# Patient Record
Sex: Female | Born: 1990 | Race: White | Hispanic: No | Marital: Married | Smoking: Never smoker
Health system: Southern US, Community
[De-identification: ages and names within clinical notes are randomized; demographics above are authoritative.]

## PROBLEM LIST (undated history)

## (undated) DIAGNOSIS — R5383 Other fatigue: Secondary | ICD-10-CM

## (undated) DIAGNOSIS — Z8619 Personal history of other infectious and parasitic diseases: Secondary | ICD-10-CM

## (undated) DIAGNOSIS — H9190 Unspecified hearing loss, unspecified ear: Secondary | ICD-10-CM

## (undated) DIAGNOSIS — I498 Other specified cardiac arrhythmias: Secondary | ICD-10-CM

## (undated) DIAGNOSIS — R51 Headache: Secondary | ICD-10-CM

## (undated) DIAGNOSIS — F419 Anxiety disorder, unspecified: Secondary | ICD-10-CM

## (undated) DIAGNOSIS — J189 Pneumonia, unspecified organism: Secondary | ICD-10-CM

## (undated) DIAGNOSIS — G8929 Other chronic pain: Secondary | ICD-10-CM

## (undated) DIAGNOSIS — R519 Headache, unspecified: Secondary | ICD-10-CM

## (undated) DIAGNOSIS — R0600 Dyspnea, unspecified: Secondary | ICD-10-CM

## (undated) DIAGNOSIS — N946 Dysmenorrhea, unspecified: Secondary | ICD-10-CM

## (undated) DIAGNOSIS — E611 Iron deficiency: Secondary | ICD-10-CM

## (undated) DIAGNOSIS — I10 Essential (primary) hypertension: Secondary | ICD-10-CM

## (undated) DIAGNOSIS — G90A Postural orthostatic tachycardia syndrome (POTS): Secondary | ICD-10-CM

## (undated) DIAGNOSIS — R112 Nausea with vomiting, unspecified: Secondary | ICD-10-CM

## (undated) DIAGNOSIS — S060X9A Concussion with loss of consciousness of unspecified duration, initial encounter: Secondary | ICD-10-CM

## (undated) DIAGNOSIS — Z9889 Other specified postprocedural states: Secondary | ICD-10-CM

## (undated) DIAGNOSIS — M419 Scoliosis, unspecified: Secondary | ICD-10-CM

## (undated) DIAGNOSIS — D649 Anemia, unspecified: Secondary | ICD-10-CM

## (undated) DIAGNOSIS — Z8742 Personal history of other diseases of the female genital tract: Secondary | ICD-10-CM

## (undated) DIAGNOSIS — R002 Palpitations: Secondary | ICD-10-CM

## (undated) HISTORY — DX: Personal history of other diseases of the female genital tract: Z87.42

## (undated) HISTORY — DX: Postural orthostatic tachycardia syndrome (POTS): G90.A

## (undated) HISTORY — DX: Headache: R51

## (undated) HISTORY — DX: Other chronic pain: G89.29

## (undated) HISTORY — DX: Scoliosis, unspecified: M41.9

## (undated) HISTORY — DX: Iron deficiency: E61.1

## (undated) HISTORY — DX: Palpitations: R00.2

## (undated) HISTORY — PX: OTHER SURGICAL HISTORY: SHX169

## (undated) HISTORY — DX: Other specified cardiac arrhythmias: I49.8

## (undated) HISTORY — DX: Personal history of other infectious and parasitic diseases: Z86.19

## (undated) HISTORY — DX: Dysmenorrhea, unspecified: N94.6

## (undated) HISTORY — DX: Anxiety disorder, unspecified: F41.9

## (undated) HISTORY — DX: Other fatigue: R53.83

## (undated) HISTORY — DX: Dyspnea, unspecified: R06.00

## (undated) HISTORY — DX: Anemia, unspecified: D64.9

## (undated) HISTORY — DX: Concussion with loss of consciousness of unspecified duration, initial encounter: S06.0X9A

## (undated) HISTORY — DX: Headache, unspecified: R51.9

---
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
## 2011-05-24 HISTORY — PX: WISDOM TOOTH EXTRACTION: SHX21

## 2011-08-20 ENCOUNTER — Encounter: Payer: Self-pay | Admitting: Internal Medicine

## 2011-08-20 ENCOUNTER — Ambulatory Visit (INDEPENDENT_AMBULATORY_CARE_PROVIDER_SITE_OTHER): Payer: BC Managed Care – PPO | Admitting: Internal Medicine

## 2011-08-20 DIAGNOSIS — R0600 Dyspnea, unspecified: Secondary | ICD-10-CM

## 2011-08-20 DIAGNOSIS — D649 Anemia, unspecified: Secondary | ICD-10-CM

## 2011-08-20 DIAGNOSIS — R209 Unspecified disturbances of skin sensation: Secondary | ICD-10-CM

## 2011-08-20 DIAGNOSIS — R202 Paresthesia of skin: Secondary | ICD-10-CM

## 2011-08-20 DIAGNOSIS — R0609 Other forms of dyspnea: Secondary | ICD-10-CM

## 2011-08-20 DIAGNOSIS — R002 Palpitations: Secondary | ICD-10-CM

## 2011-08-20 NOTE — Patient Instructions (Signed)
Your physician recommends that you continue on your current medications as directed. Please refer to the Current Medication list given to you today. Your physician has recommended that you wear an event monitor. Event monitors are medical devices that record the heart's electrical activity. Doctors most often [place] these monitors to diagnose arrhythmias. Arrhythmias are problems with the speed or rhythm of the heartbeat. The monitor is a small, portable device. You can wear one while you do your normal daily activities. This is usually used to diagnose what is causing palpitations/syncope (passing out). DX 785.1 30 DAY EVENT MONITOR Your physician has requested that you have an echocardiogram. Echocardiography is a painless test that uses sound waves to create images of your heart. It provides your doctor with information about the size and shape of your heart and how well your heart's chambers and valves are working. This procedure takes approximately one hour. There are no restrictions for this procedure. DUE  IN NOV DX DYSPNEA Your physician has requested that you have an exercise tolerance test. For further information please visit [email or web address] Please also follow instruction sheet, as given. DUE IN NOV DX DYSPNEA

## 2011-08-21 ENCOUNTER — Encounter: Payer: Self-pay | Admitting: Internal Medicine

## 2011-08-21 DIAGNOSIS — R202 Paresthesia of skin: Secondary | ICD-10-CM | POA: Insufficient documentation

## 2011-08-21 DIAGNOSIS — R0600 Dyspnea, unspecified: Secondary | ICD-10-CM | POA: Insufficient documentation

## 2011-08-21 DIAGNOSIS — R002 Palpitations: Secondary | ICD-10-CM | POA: Insufficient documentation

## 2011-08-21 DIAGNOSIS — D649 Anemia, unspecified: Secondary | ICD-10-CM | POA: Insufficient documentation

## 2011-08-21 NOTE — Assessment & Plan Note (Signed)
She has paresthesias in her upper and lower extremities associated with various angles of her joints. I was not able to reproduce any loss of pulse in her upper extremities with maneuvers. I'm not sure that I have an explanation for this. I certainly don't know how it utilized with the prior symptoms.

## 2011-08-21 NOTE — Assessment & Plan Note (Signed)
Produced he is a little bit concerning. Especially so as she has accompanying edema. We will get a 2-D echo cardiogram to look at left ventricular function and assess right ventricular and pulmonary pressures. She has scoliosis, but this has not been so limiting that I would have expected it to cause hemodynamic consequences to her right side. With the keep this in mind.

## 2011-08-21 NOTE — Progress Notes (Signed)
HPI: Stephanie Hudson is a 20 y.o. female At the request of Dr. Katrinka Blazing.  She has a multitude of complaints. About 6 months ago,She began having a sensation that her heart was skipping.  These episodes are quite brief. She feels him initially in her throat. There is a sensation of the heart beating quickly. The duration is seconds. They have become increasingly frequent. They're unassociated with caffeine. He thinks he may be aggravated by stress.  She also has noted dyspnea on exertion. SHe used to be quite fit  trained as a Horticulturist, commercial. She is to be in the gym daily. He is no longer period she has a history of asthma, but she thinks that this is different. SHe now finds herself short of breath when she goes to classes. Interestingly, she also has an history of edema. She was on a diuretic because of ear issues earlier the edema resolving there with. With the discontinuation of the sternal activities had some reaccumulation.  She also has complaints of tingling in her hands, especially when she raises her arms as if driving in the car with her head on the window. Interestingly also, she has tingling in her feet when she sits on the floor for prolonged periods of time.  Her past medical history in addition to the above is notable for scoliosis Current Outpatient Prescriptions  Medication Sig Dispense Refill  . albuterol (PROVENTIL HFA;VENTOLIN HFA) 108 (90 BASE) MCG/ACT inhaler Inhale 2 puffs into the lungs every 6 (six) hours as needed.        . Fluticasone-Salmeterol (ADVAIR) 100-50 MCG/DOSE AEPB Inhale 1 puff into the lungs every 12 (twelve) hours.        . IRON PO Take by mouth 2 (two) times daily.        . Norethindrone Acetate-Ethinyl Estrad-FE (LOESTRIN 24 FE) 1-20 MG-MCG(24) tablet Take 1 tablet by mouth daily.          Allergies  Allergen Reactions  . Latex     Past Medical History  Diagnosis Date  . Chronic headaches   . Asthma   . Dysmenorrhea   . Fatigue   . Anemia     Past  Surgical History  Procedure Date  . Tonsillectomy   . Tubes     In ears as baby    No family history on file.  History   Social History  . Marital Status: Single    Spouse Name: N/A    Number of Children: N/A  . Years of Education: N/A   Occupational History  . Not on file.   Social History Main Topics  . Smoking status: Never Smoker   . Smokeless tobacco: Not on file  . Alcohol Use: No  . Drug Use:   . Sexually Active:    Other Topics Concern  . Not on file   Social History Narrative  . No narrative on file    Fourteen point review of systems was negative except as noted in HPI and PMH   PHYSICAL EXAMINATION  Blood pressure 104/72, pulse 65, height 5' 7.5" (1.715 m), weight 135 lb 12.8 oz (61.598 kg).   Well developed and nourished in no acute distress HENT normal Neck supple with JVP-flat Carotids brisk and full without bruits Back  Was notable scoliosis Clear Regular rate and rhythm, no murmurs or gallops Abd-soft with active BS without hepatomegaly or midline pulsation Femoral pulses 2+ distal pulses intact No Clubbing cyanosis edema Skin-warm and dry LN-neg submandibular and supraclavicular A &  Oriented CN 3-12 normal  Grossly normal sensory and motor function Affect engaging . Sinus rhythm at 65 Intervals 0.1/0.08/0.42 Rightward axis at 91

## 2011-08-21 NOTE — Assessment & Plan Note (Signed)
The patient has palpitations that are quite brief. Statistically about 70% of people will have arrhythmia associated with her palpitations, and the easiest way to clarify this would be to use an event recorder. We will use a single event recorder. She would like to wait until November.

## 2011-08-21 NOTE — Assessment & Plan Note (Signed)
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 Still, however she remains anemic and I went ahead as a contributing to the above.

## 2011-09-13 ENCOUNTER — Telehealth: Payer: Self-pay

## 2011-09-18 NOTE — Telephone Encounter (Signed)
I Talk to Stephanie Hudson about this  monitor   becaue patient does not have  landline phone for the kind of monitor Dr. Graciela Husbands would like for patient to have.  The [facility] of heart monitor.

## 2011-10-16 ENCOUNTER — Ambulatory Visit (INDEPENDENT_AMBULATORY_CARE_PROVIDER_SITE_OTHER): Payer: 59 | Admitting: Internal Medicine

## 2011-10-16 ENCOUNTER — Ambulatory Visit (HOSPITAL_COMMUNITY): Payer: 59 | Attending: Cardiology | Admitting: Radiology

## 2011-10-16 DIAGNOSIS — R0602 Shortness of breath: Secondary | ICD-10-CM

## 2011-10-16 DIAGNOSIS — R0600 Dyspnea, unspecified: Secondary | ICD-10-CM

## 2011-10-16 DIAGNOSIS — R002 Palpitations: Secondary | ICD-10-CM

## 2011-10-16 DIAGNOSIS — R0989 Other specified symptoms and signs involving the circulatory and respiratory systems: Secondary | ICD-10-CM | POA: Insufficient documentation

## 2011-10-16 DIAGNOSIS — R0609 Other forms of dyspnea: Secondary | ICD-10-CM | POA: Insufficient documentation

## 2011-10-16 DIAGNOSIS — I059 Rheumatic mitral valve disease, unspecified: Secondary | ICD-10-CM | POA: Insufficient documentation

## 2011-10-16 NOTE — Progress Notes (Signed)
Exercise Treadmill Test  Pre-Exercise Testing Evaluation Rhythm: normal sinus  Rate: 80   PR:  .13 QRS:  .08  QT:  .39 QTc: .45     Test  Exercise Tolerance Test Ordering MD: Sherryl Manges, MD  Interpreting MD:  Sherryl Manges, MD  Unique Test No: 1  Treadmill:  1  Indication for ETT: exertional dyspnea  Contraindication to ETT: No   Stress Modality: exercise - treadmill  Cardiac Imaging Performed: non   Protocol: standard Bruce - maximal  Max BP:  142/59  Max MPHR (bpm):  200 85% MPR (bpm):  170  MPHR obtained (bpm):  160 % MPHR obtained:  79%  Reached 85% MPHR (min:sec):   Total Exercise Time (min-sec):  6:16  Workload in METS:  9.6 Borg Scale: 15  Reason ETT Terminated:  hypotension (>64mm drop SBP)    ST Segment Analysis At Rest: normal ST segments - no evidence of significant ST depression With Exercise: no evidence of significant ST depression  Other Information Arrhythmia:  Yes Angina during ETT:  absent (0) Quality of ETT:  diagnostic  ETT Interpretation:  normal - no evidence of ischemia by ST analysis  Comments: Evidence of inapproprioate sinus tachycardia and exercise hypotension Dx POTS  Rec salt and water   Recommendations:

## 2011-10-29 ENCOUNTER — Telehealth: Payer: Self-pay | Admitting: Internal Medicine

## 2011-10-29 NOTE — Telephone Encounter (Signed)
The patient is aware of her results.  

## 2011-10-29 NOTE — Telephone Encounter (Signed)
Fu call °Pt returning your call  °

## 2011-11-29 ENCOUNTER — Encounter: Payer: Self-pay | Admitting: *Deleted

## 2011-12-14 ENCOUNTER — Encounter: Payer: Self-pay | Admitting: Internal Medicine

## 2011-12-14 ENCOUNTER — Ambulatory Visit (INDEPENDENT_AMBULATORY_CARE_PROVIDER_SITE_OTHER): Payer: 59 | Admitting: Internal Medicine

## 2011-12-14 VITALS — BP 111/66 | HR 86 | Ht 67.0 in | Wt 136.8 lb

## 2011-12-14 DIAGNOSIS — G909 Disorder of the autonomic nervous system, unspecified: Secondary | ICD-10-CM

## 2011-12-14 DIAGNOSIS — G901 Familial dysautonomia [Riley-Day]: Secondary | ICD-10-CM | POA: Insufficient documentation

## 2011-12-14 NOTE — Progress Notes (Signed)
  HPI  Stephanie Hudson is a 21 y.o. female With exercise assoc hypotension and tachycardia   She has struggled to exercise with tachypalp and intolerance   She underwent GXT in 12/12 and salt was recommended  She has modestly complied.  She had tried recumbent exercies but has been limited by tachypalp  Urine remains yellow  Salt intake remains modest  Past Medical History  Diagnosis Date  . Palpitations   . Asthma   . Dysmenorrhea   . Fatigue   . Anemia   . Scoliosis   . Chronic headaches   . Dyspnea     Past Surgical History  Procedure Date  . Tonsillectomy   . Tubes     In ears as baby    Current Outpatient Prescriptions  Medication Sig Dispense Refill  . Fluticasone-Salmeterol (ADVAIR) 100-50 MCG/DOSE AEPB Inhale 1 puff into the lungs every 12 (twelve) hours.        . IRON PO Take by mouth 2 (two) times daily.        . NON FORMULARY Take 1 tablet by mouth daily. Salt tabs      . Norethindrone Acetate-Ethinyl Estrad-FE (LOESTRIN 24 FE) 1-20 MG-MCG(24) tablet Take 1 tablet by mouth daily.        Marland Kitchen albuterol (PROVENTIL HFA;VENTOLIN HFA) 108 (90 BASE) MCG/ACT inhaler Inhale 2 puffs into the lungs every 6 (six) hours as needed.          Allergies  Allergen Reactions  . Latex     Review of Systems negative except from HPI and PMH  Physical Exam BP 98/66  Pulse 84  Ht 5\' 7"  (1.702 m)  Wt 136 lb 12.8 oz (62.052 kg)  BMI 21.43 kg/m2  SpO2 100% Well developed and well nourished in no acute distress HENT normal E scleral and icterus clear Neck Supple JVP flat; carotids brisk and full Clear to ausculation Regular rate and rhythm, no murmurs gallops or rub Soft with active bowel sounds No clubbing cyanosis none Edema Alert and oriented, grossly normal motor and sensory function Skin Warm and Dry   Assessment and  Plan

## 2011-12-14 NOTE — Assessment & Plan Note (Addendum)
Her stress test was quite consistent with dysautonomia with exercise associated tachycardia and hypotension. Her symptoms are still consistent although her objective measurements today were not. I would for now pursue a therapeutic course based on the presumptive diagnosis and have reviewed with her exercise protocol and dietary intake published that seemed to be helpful in making the symptoms of patients with Pots  we discussed this and review this for about 45-50 minutes this afternoon.

## 2011-12-14 NOTE — Patient Instructions (Signed)
Your physician wants you to follow-up in: April 2013 You will receive a reminder letter in the mail two months in advance. If you don't receive a letter, please call our office to schedule the follow-up appointment.  

## 2011-12-14 NOTE — Progress Notes (Signed)
   Patient ID: Stephanie Hudson, female    DOB: Dec 06, 1990, 21 y.o.   MRN: [id number]  HPI    Review of Systems    Physical Exam

## 2011-12-28 ENCOUNTER — Telehealth: Payer: Self-pay | Admitting: Internal Medicine

## 2011-12-28 DIAGNOSIS — G901 Familial dysautonomia [Riley-Day]: Secondary | ICD-10-CM

## 2011-12-28 NOTE — Telephone Encounter (Signed)
New Msg: Pt calling stating that she needs medication to lower pt BP. Please return pt call to discuss further.

## 2011-12-28 NOTE — Telephone Encounter (Signed)
Spoke with pt, she has only been able to exercise for about 15 min and then her heart rate will elevate and she feels faint. She reports dr Graciela Husbands told her to call because he could give her something to help with these symptoms. Aware dr Graciela Husbands not here and will forward for his review.

## 2011-12-31 NOTE — Telephone Encounter (Signed)
We can try her on low dose betablocker, with her asthma, will [place] B specificgive Rx for metoprolol tart 25 bid, metop succ 25 qd, and atenolol 25 qd and have her try in whatever order, and let us know if she can tolerate any of thme, and  Whether they help with tachypalps

## 2012-01-01 MED ORDER — METOPROLOL TARTRATE 25 MG PO TABS: 25.0000 mg | ORAL_TABLET | Freq: Two times a day (BID) | ORAL | Status: DC

## 2012-01-01 MED ORDER — ATENOLOL 25 MG PO TABS: 25.0000 mg | ORAL_TABLET | Freq: Every day | ORAL | Status: DC

## 2012-01-01 MED ORDER — METOPROLOL SUCCINATE ER 25 MG PO TB24: 25.0000 mg | ORAL_TABLET | Freq: Every day | ORAL | Status: DC

## 2012-01-01 NOTE — Telephone Encounter (Signed)
Fu call Pt returning your call Please call after 330

## 2012-01-01 NOTE — Telephone Encounter (Signed)
I spoke with the patient and she is aware of Dr. Odessa Fleming recommendations. She has been educated on how to use these medications. She will let us know how she is doing. If she is feeling a lot of fatigue, we may can talk with Dr. Graciela Husbands about her using her Beta blockers as needed. She voices understanding.

## 2012-01-01 NOTE — Telephone Encounter (Signed)
I left a message for the patient to call. 

## 2012-01-01 NOTE — Telephone Encounter (Signed)
LMTC

## 2012-01-10 ENCOUNTER — Other Ambulatory Visit: Payer: Self-pay | Admitting: Internal Medicine

## 2012-02-22 ENCOUNTER — Encounter: Payer: Self-pay | Admitting: Internal Medicine

## 2012-02-22 ENCOUNTER — Ambulatory Visit (INDEPENDENT_AMBULATORY_CARE_PROVIDER_SITE_OTHER): Payer: 59 | Admitting: Internal Medicine

## 2012-02-22 VITALS — BP 104/72 | HR 84 | Ht 68.0 in | Wt 139.1 lb

## 2012-02-22 DIAGNOSIS — G909 Disorder of the autonomic nervous system, unspecified: Secondary | ICD-10-CM

## 2012-02-22 DIAGNOSIS — G901 Familial dysautonomia [Riley-Day]: Secondary | ICD-10-CM

## 2012-02-22 MED ORDER — ATENOLOL 25 MG PO TABS: 25.0000 mg | ORAL_TABLET | Freq: Every day | ORAL | Status: DC

## 2012-02-22 NOTE — Patient Instructions (Signed)
Your physician recommends that you continue on your current medications as directed. Please refer to the Current Medication list given to you today.  Your physician wants you to follow-up in: 6 months with Dr. Klein. You will receive a reminder letter in the mail two months in advance. If you don't receive a letter, please call our office to schedule the follow-up appointment.  

## 2012-02-22 NOTE — Assessment & Plan Note (Signed)
Continue current medications. She'll continue his atenolol at night. We will write her a letter for special considerations [place] regarding walking distances etc. given her dysautonomia.

## 2012-02-22 NOTE — Progress Notes (Signed)
  HPI  Stephanie Hudson is a 21 y.o. female ith exercise assoc hypotension and tachycardia   She has struggled to exercise with tachypalp and intolerance   She underwent GXT in 12/12 and salt was recommended  She has modestly complied.  She had tried recumbent exercies but has been limited by tachypalp  She is involved in graduated exercise and is able to work 30/20 minutes on 2 different exercise programs as well as doing aerobic begins. The onset of the heat has been difficult and was walking to class. Overall though she is much improved. She takes her atenolol at night because of fatigue.  Past Medical History  Diagnosis Date  . Palpitations   . Asthma   . Dysmenorrhea   . Fatigue   . Anemia   . Scoliosis   . Chronic headaches   . Dyspnea   . Hx of menorrhagia   . Headache   . Low iron   . History of chicken pox     Past Surgical History  Procedure Date  . Tonsillectomy   . Tubes     In ears as baby    Current Outpatient Prescriptions  Medication Sig Dispense Refill  . albuterol (PROVENTIL HFA;VENTOLIN HFA) 108 (90 BASE) MCG/ACT inhaler Inhale 2 puffs into the lungs every 6 (six) hours as needed.        Marland Kitchen atenolol (TENORMIN) 25 MG tablet TAKE 1 TABLET BY MOUTH ONCE DAILY  30 tablet  6  . Fluticasone-Salmeterol (ADVAIR) 100-50 MCG/DOSE AEPB Inhale 1 puff into the lungs as needed.       . IRON PO Take by mouth daily. 1-2 tablets.      . NON FORMULARY Take 1 tablet by mouth daily. Salt tabs      . Norethindrone Acetate-Ethinyl Estrad-FE (LOESTRIN 24 FE) 1-20 MG-MCG(24) tablet Take 1 tablet by mouth daily.          Allergies  Allergen Reactions  . Latex     Review of Systems negative except from HPI and PMH  Physical Exam BP 104/72  Pulse 84  Ht 5\' 8"  (1.727 m)  Wt 139 lb 1.9 oz (63.104 kg)  BMI 21.15 kg/m2  SpO2 99% Well developed and well nourished in no acute distress HENT normal E scleral and icterus clear Neck Supple JVP flat; carotids brisk and full Clear  to ausculation Regular rate and rhythm, no murmurs gallops or rub Soft with active bowel sounds No clubbing cyanosis none Edema Alert and oriented, grossly normal motor and sensory function Skin Warm and Dry   Assessment and  Plan

## 2012-02-29 ENCOUNTER — Ambulatory Visit (INDEPENDENT_AMBULATORY_CARE_PROVIDER_SITE_OTHER): Payer: 59 | Admitting: Obstetrics and Gynecology

## 2012-02-29 ENCOUNTER — Encounter: Payer: Self-pay | Admitting: Obstetrics and Gynecology

## 2012-02-29 VITALS — BP 100/64 | Resp 20 | Wt 138.0 lb

## 2012-02-29 DIAGNOSIS — Z3040 Encounter for surveillance of contraceptives, unspecified: Secondary | ICD-10-CM

## 2012-02-29 NOTE — Progress Notes (Deleted)
The patient reports:no discharge or pelvic pain, no complaints  Contraception:Orthotricyclen  Last mammogram: {findings; last mammo:13141::"not applicable"} {MONTH:22386} 20*** Last pap: {findings; last pap:13140::"not applicable"} {MONTH:22386}  20***  GC/Chlamydia cultures offered: declined HIV/RPR/HbsAg offered:  declined HSV 1 and 2 glycoprotein offered: declined  Menstrual cycle regular and monthly: No: *** Menstrual flow normal: No: Constant Heavy Bleeding  Urinary symptoms: none Normal bowel movements: No: *** Reports abuse at home: No: ***

## 2012-02-29 NOTE — Progress Notes (Signed)
21 yo G0 with history of menorrhagia and dysmenorrhea presents for OCP follow-up.  On continuous dosing. Started OrthoCyclen late 2011, after being on Lo Loestrin with no help for cycles. Stable until January, 2013, with bleeding x 3 weeks, heavy.  February had cycle that started on Valentine's Day x 4 days, then mid-March x 4 days (1 month from last cycle)--heavy cycles.  Issues reviewed. Will try cyclic use of OrthoNovum 1/35, and follow-up with me by phone in July or prn. Patient and mother agreeable with plan. Patient will be out of state for summer in [place].

## 2012-03-13 ENCOUNTER — Telehealth: Payer: Self-pay | Admitting: Obstetrics and Gynecology

## 2012-03-13 NOTE — Telephone Encounter (Signed)
Routed to nurse pool 

## 2012-03-14 ENCOUNTER — Telehealth: Payer: Self-pay

## 2012-03-14 NOTE — Telephone Encounter (Signed)
Spoke with pt's mother regarding BC rf. Pt states daughter received rx Mononessa but supposed to receive OrthoNovum 1/35. Called [facility] spoke with Misty Stanley states they are the same medication. Pt informed and voices understanding.

## 2012-05-27 ENCOUNTER — Telehealth: Payer: Self-pay | Admitting: Internal Medicine

## 2012-05-27 NOTE — Telephone Encounter (Signed)
Walk in pt Form "[employer]" Dropped off by Pt  Sent to Geroge Baseman 05/27/12/KM

## 2012-09-29 ENCOUNTER — Telehealth: Payer: Self-pay | Admitting: Internal Medicine

## 2012-09-29 NOTE — Telephone Encounter (Signed)
Pt was feeling chest discomfort and palpitations and went to school [facility] ctr and was told to got to ED and they took her b/p it was 138/110. She wants to talk to a nurse to make sure that is what she needs to do.

## 2012-09-29 NOTE — Telephone Encounter (Signed)
**Note De-Identified [name] Obfuscation** Stephanie Hudson is advised to call 911 or have someone drive her to ER as pt. states that her heart feels sore and is skipping beats and she has a bad headache. She verbalized understanding.

## 2012-10-20 ENCOUNTER — Encounter: Payer: Self-pay | Admitting: Physician Assistant

## 2012-10-20 ENCOUNTER — Ambulatory Visit (INDEPENDENT_AMBULATORY_CARE_PROVIDER_SITE_OTHER): Payer: 59 | Admitting: Physician Assistant

## 2012-10-20 VITALS — BP 102/78 | HR 64 | Ht 68.0 in | Wt 145.1 lb

## 2012-10-20 DIAGNOSIS — R51 Headache: Secondary | ICD-10-CM

## 2012-10-20 DIAGNOSIS — G909 Disorder of the autonomic nervous system, unspecified: Secondary | ICD-10-CM

## 2012-10-20 DIAGNOSIS — R002 Palpitations: Secondary | ICD-10-CM

## 2012-10-20 DIAGNOSIS — R030 Elevated blood-pressure reading, without diagnosis of hypertension: Secondary | ICD-10-CM

## 2012-10-20 DIAGNOSIS — G901 Familial dysautonomia [Riley-Day]: Secondary | ICD-10-CM

## 2012-10-20 DIAGNOSIS — R079 Chest pain, unspecified: Secondary | ICD-10-CM

## 2012-10-20 MED ORDER — ATENOLOL 25 MG PO TABS: 25.0000 mg | ORAL_TABLET | Freq: Every day | ORAL | Status: DC

## 2012-10-20 NOTE — Progress Notes (Signed)
[address] [place], [place]  [address] Phone: [phone], Fax:  [phone]  Date:  10/20/2012   Name:  Stephanie Hudson   DOB:  11/10/1991   MRN:  [id number]  PCP:  Allean Found, MD  Primary Cardiologist:  Dr. Sherryl Manges    History of Present Illness: Stephanie Hudson is a 21 y.o. female who returns for evaluation of elevated blood pressure.  She has a history of dysautonomia. Last seen by Dr. Graciela Husbands in 4/13. She has been treated with low-dose beta blocker. ETT 12/12: No ischemic ST changes, positive inappropriate sinus tachycardia and exercise induced hypotension (POTS). Echo 12/12: EF 55-60%, normal wall motion.  She generally has had fairly well controlled symptoms of her dysautonomia while taking atenolol and observing the graduated exercise program given to her by Dr. Graciela Husbands. She ran out of her atenolol in the early fall. She also stopped the exercise program over the summer when she went to camp. She describes an episode of increased palpitations shortly after leaving class several weeks ago (she attends [employer]). She went to student health. She was told her blood pressure was extremely elevated. Her blood pressure was reportedly 118 diastolic. She noted some decreased vision in her left eye. She states it was blurry. She notes a headache as well. This was fairly global. She denies any throbbing sensation. She denies photophobia or phonophobia. She denies any scotoma. She had some chest tightness associated with all of this. She denies facial droop. She denies unilateral weakness. She was urged to go the emergency room. However, she refused. She states that she returned later and noted that her pressure has gradually come down. She rested and her symptoms subsided by the time she woke. In retrospect, she does note associated diaphoresis and flushing sensation with her symptoms.  Of note, she has felt well since this episode occurred.  Wt Readings  from Last 3 Encounters:  10/20/12 145 lb 1.9 oz (65.826 kg)  02/29/12 138 lb (62.596 kg) (66.54%*)  02/22/12 139 lb 1.9 oz (63.104 kg) (68.15%*)   * Growth percentiles are based on CDC 2-20 Years data.     Past Medical History  Diagnosis Date  . Palpitations   . Asthma   . Dysmenorrhea   . Fatigue   . Anemia   . Scoliosis   . Chronic headaches   . Dyspnea   . Hx of menorrhagia   . Headache   . Low iron   . History of chicken pox     Current Outpatient Prescriptions  Medication Sig Dispense Refill  . albuterol (PROVENTIL HFA;VENTOLIN HFA) 108 (90 BASE) MCG/ACT inhaler Inhale 2 puffs into the lungs every 6 (six) hours as needed.        Marland Kitchen atenolol (TENORMIN) 25 MG tablet Take 1 tablet (25 mg total) by mouth daily.  90 tablet  3  . Fluticasone-Salmeterol (ADVAIR) 100-50 MCG/DOSE AEPB Inhale 1 puff into the lungs as needed.       . IRON PO Take by mouth daily. 1-2 tablets.      . Norethindrone Acetate-Ethinyl Estrad-FE (LOESTRIN 24 FE) 1-20 MG-MCG(24) tablet Take 1 tablet by mouth daily.         Allergies: Allergies  Allergen Reactions  . Latex     Social History:  The patient  reports that she has never smoked. She has never used smokeless tobacco. She reports that she does not drink alcohol or use illicit drugs.   ROS:  Please see the history of present illness.   No fevers, chills, melena, hematochezia. All other systems reviewed and negative.   PHYSICAL EXAM: VS:  BP 102/78  Pulse 64  Ht 5\' 8"  (1.727 m)  Wt 145 lb 1.9 oz (65.826 kg)  BMI 22.07 kg/m2 Well nourished, well developed, in no acute distress HEENT: normal Neck: no JVD Vascular: No carotid bruits Endocrine: No thyromegaly Cardiac:  normal S1, S2; RRR; no murmur Lungs:  clear to auscultation bilaterally, no wheezing, rhonchi or rales Abd: soft, nontender, no hepatomegaly Ext: no edema Skin: warm and dry Neuro:  CNs 2-12 intact, no focal abnormalities noted  EKG:  NSR, HR 64, no ischemic changes       ASSESSMENT AND PLAN:  1. Dysautonomia:   Her symptoms have generally been well controlled with taking atenolol as prescribed by Dr. Graciela Husbands as well as maintaining the graduated exercise program given to her previously. She has been off of both of these therapies. I have recommended that she resume them. We will refill her atenolol for her.  2. Headache:   She had a constellation of symptoms including a generalized headache with left visual symptoms, palpitations, diaphoresis, flushing, chest discomfort with associated hypertension. Interestingly enough, she was off of atenolol at that time. She does have a remote history of migraine headaches in the past. 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 She does not really have frequent migraines. She has been evaluated and followed by neurology in the past. She sees Dr. Sharene Skeans and [name], NP. I have recommended that she followup with neurology to discuss her symptoms. Defer any further testing or evaluation to neurology.  3. Elevated Blood Pressure:   As noted above, she reportedly had a very high blood pressure with her symptoms. Although this would be extremely rare, I recommended she proceed with a 24 urine to check for catecholamines metanephrines given the constellation of her symptoms.  4. Chest Discomfort:   Symptoms were likely related to her palpitations. She has also had increased indigestion recently. She does not require further ischemic evaluation this time.  5. Disposition:   Arrange followup with Dr. Graciela Husbands in the next 6 weeks.  Signed, Tereso Newcomer, PA-C  9:19 AM 10/20/2012

## 2012-10-20 NOTE — Patient Instructions (Addendum)
A REFILL FOR ATENOLOL WAS SENT IN TODAY  LAB TODAY; 24 HOUR URINE COLLECTION WITH CATECHOLAMINES, METANEPHRINE'S  RESUME EXERCISE PROGRAM FROM DR. KLEIN  YOU HAVE BEEN INSTRUCTED TO CALL NEUROLOGY (DR. HICKLING OR DR. Blane Ohara) FOR HEADACHE  PLEASE MAKE AN APPT TO SEE DR. KLEIN IN ABOUT 6 WEEKS PER SCOTT WEAVER, PAC

## 2012-10-27 ENCOUNTER — Other Ambulatory Visit (INDEPENDENT_AMBULATORY_CARE_PROVIDER_SITE_OTHER): Payer: 59

## 2012-10-27 DIAGNOSIS — R51 Headache: Secondary | ICD-10-CM

## 2012-10-27 DIAGNOSIS — R002 Palpitations: Secondary | ICD-10-CM

## 2012-10-27 DIAGNOSIS — G901 Familial dysautonomia [Riley-Day]: Secondary | ICD-10-CM

## 2012-10-27 DIAGNOSIS — IMO0001 Reserved for inherently not codable concepts without codable children: Secondary | ICD-10-CM

## 2012-10-27 DIAGNOSIS — R0989 Other specified symptoms and signs involving the circulatory and respiratory systems: Secondary | ICD-10-CM

## 2012-10-27 NOTE — Addendum Note (Signed)
Addended by: Tarri Fuller on: 10/27/2012 12:12 PM   Modules accepted: Orders

## 2012-10-30 LAB — METANEPHRINES, URINE, 24 HOUR
Metaneph Total, Ur: 202 mcg/24 h (ref 94–604)
Metanephrines, Ur: 59 mcg/24 h (ref 25–222)
Normetanephrine, 24H Ur: 143 mcg/24 h (ref 40–412)

## 2012-10-31 ENCOUNTER — Telehealth: Payer: Self-pay | Admitting: *Deleted

## 2012-10-31 LAB — CATECHOLAMINES, FRACTIONATED, URINE, 24 HOUR
Calculated Total (E+NE): 37 mcg/24 h (ref 26–121)
Creatinine, Urine mg/day-CATEUR: 0.75 g/(24.h) (ref 0.63–2.50)
Dopamine, 24 hr Urine: 286 mcg/24 h (ref 52–480)
Epinephrine, 24 hr Urine: 4 mcg/24 h (ref 2–24)
Norepinephrine, 24 hr Ur: 33 mcg/24 h (ref 15–100)

## 2012-10-31 NOTE — Telephone Encounter (Signed)
Message copied by Tarri Fuller on Fri Oct 31, 2012 10:55 AM ------      Message from: [place], [place] T      Created: Fri Oct 31, 2012  8:07 AM       Normal      Tereso Newcomer, [place]  8:07 AM 10/31/2012

## 2012-10-31 NOTE — Telephone Encounter (Signed)
pt notified about 24 hour urine results normal

## 2012-12-11 ENCOUNTER — Ambulatory Visit (INDEPENDENT_AMBULATORY_CARE_PROVIDER_SITE_OTHER): Payer: 59 | Admitting: Internal Medicine

## 2012-12-11 ENCOUNTER — Encounter: Payer: Self-pay | Admitting: Internal Medicine

## 2012-12-11 VITALS — BP 104/68 | HR 81 | Ht 67.5 in | Wt 146.2 lb

## 2012-12-11 DIAGNOSIS — R030 Elevated blood-pressure reading, without diagnosis of hypertension: Secondary | ICD-10-CM

## 2012-12-11 DIAGNOSIS — G901 Familial dysautonomia [Riley-Day]: Secondary | ICD-10-CM

## 2012-12-11 DIAGNOSIS — G909 Disorder of the autonomic nervous system, unspecified: Secondary | ICD-10-CM

## 2012-12-11 NOTE — Progress Notes (Signed)
Patient Care Team: Candace Darolyn Rua, MD as PCP - General (Family Medicine)   HPI  Stephanie Hudson is a 22 y.o. female Seen in followup for palpitations exercise intolerance but has been attributed to dysautonomia. When we saw her last she was working on compliance and graduated exercise. She has been noted to have elevated blood pressure. She did treated with atenolol. Because of constellation of symptoms, she was admitted for urine catecholamines which were negative-12/13 she was exceedingly hot and developed visual disturbances with this and head aches remniscient of her migraines   she also complains of being extremely sleepy. I note from the computer record that she carries a diagnosis of "fatigue". She is a heavy sleeper according to her mother; some thought that sometimes she may stop breathing at night.  She's been tried on various beta blockers. The atenolol, initially, cause some fatigue which then resolved. Had discontinued it on her own and it was resumed in November. She takes it every other day.  She has not seen her PCP for potential medical causes of fatigue  Past Medical History  Diagnosis Date  . Palpitations   . Asthma   . Dysmenorrhea   . Fatigue   . Anemia   . Scoliosis   . Chronic headaches   . Dyspnea   . Hx of menorrhagia   . Headache   . Low iron   . History of chicken pox     Past Surgical History  Procedure Date  . Tonsillectomy   . Tubes     In ears as baby    Current Outpatient Prescriptions  Medication Sig Dispense Refill  . albuterol (PROVENTIL HFA;VENTOLIN HFA) 108 (90 BASE) MCG/ACT inhaler Inhale 2 puffs into the lungs every 6 (six) hours as needed.        Marland Kitchen atenolol (TENORMIN) 25 MG tablet Take 25 mg by mouth every other day.      . IRON PO Take by mouth daily. 1-2 tablets.      . Norethindrone Acetate-Ethinyl Estrad-FE (LOESTRIN 24 FE) 1-20 MG-MCG(24) tablet Take 1 tablet by mouth daily.         Allergies  Allergen Reactions  . Latex      Review of Systems negative except from HPI and PMH  Physical Exam BP 104/61  Pulse 75  Ht 5' 7.5" (1.715 m)  Wt 146 lb 3.2 oz (66.316 kg)  BMI 22.56 kg/m2 Well developed and well nourished in no acute distress HENT normal E scleral and icterus clear Neck Supple JVP flat; carotids brisk and full Clear to ausculation scoliosis  Regular rate and rhythm, no murmurs gallops or rub Soft with active bowel sounds No clubbing cyanosis none Edema Alert and oriented, grossly normal motor and sensory function Skin Warm and Dry  For  Assessment and  Plan

## 2012-12-11 NOTE — Assessment & Plan Note (Signed)
This seems to be better. Her blood pressure or lower. Her palpitations are largely quiet since. She is able to exercise some. She has not been taking salt because she can't swallow the stress tabs. She does not like drinking Gatorade because of its caloric burden. I have suggested that she try taking regular salt tablets which he gets from [facility]

## 2012-12-11 NOTE — Assessment & Plan Note (Signed)
The patient had an episode of elevated blood pressure in the context of a somewhat typical migraine headache and visual disturbances. She was told that it was unlikely that the migraine caused her blood pressure; it was apparently more likely that it was the other way around.  I really don't have enough expertise on this either way. I will send blood pressure with the issue that we the patient another diagnosis. I will try and review this with Dr. Sharene Skeans

## 2012-12-11 NOTE — Patient Instructions (Signed)
Your physician has recommended you make the following change in your medication:  1) stop atenolol.  Call Dr. Odessa Fleming nurse in about 3 weeks and let us know how you are feeling off atenolol.  Your physician wants you to follow-up in: 6 months with Dr. Graciela Husbands. You will receive a reminder letter in the mail two months in advance. If you don't receive a letter, please call our office to schedule the follow-up appointment.

## 2013-01-14 ENCOUNTER — Telehealth: Payer: Self-pay

## 2013-01-14 NOTE — Telephone Encounter (Signed)
Lm on vm to call back

## 2013-06-22 ENCOUNTER — Ambulatory Visit (INDEPENDENT_AMBULATORY_CARE_PROVIDER_SITE_OTHER): Payer: 59 | Admitting: Internal Medicine

## 2013-06-22 ENCOUNTER — Encounter: Payer: Self-pay | Admitting: Internal Medicine

## 2013-06-22 VITALS — BP 100/69 | HR 80 | Ht 67.0 in | Wt 146.0 lb

## 2013-06-22 DIAGNOSIS — G901 Familial dysautonomia [Riley-Day]: Secondary | ICD-10-CM

## 2013-06-22 DIAGNOSIS — R202 Paresthesia of skin: Secondary | ICD-10-CM

## 2013-06-22 DIAGNOSIS — G909 Disorder of the autonomic nervous system, unspecified: Secondary | ICD-10-CM

## 2013-06-22 DIAGNOSIS — R29898 Other symptoms and signs involving the musculoskeletal system: Secondary | ICD-10-CM

## 2013-06-22 DIAGNOSIS — I951 Orthostatic hypotension: Secondary | ICD-10-CM

## 2013-06-22 DIAGNOSIS — R000 Tachycardia, unspecified: Secondary | ICD-10-CM

## 2013-06-22 DIAGNOSIS — R002 Palpitations: Secondary | ICD-10-CM

## 2013-06-22 DIAGNOSIS — R209 Unspecified disturbances of skin sensation: Secondary | ICD-10-CM

## 2013-06-22 DIAGNOSIS — M6281 Muscle weakness (generalized): Secondary | ICD-10-CM

## 2013-06-22 NOTE — Patient Instructions (Addendum)
Your physician wants you to follow-up in: 6 MONTHS WITH DR KLEIN You will receive a reminder letter in the mail two months in advance. If you don't receive a letter, please call our office to schedule the follow-up appointment.  

## 2013-06-22 NOTE — Assessment & Plan Note (Signed)
Much improved on salt water and elimination of periods

## 2013-06-22 NOTE — Assessment & Plan Note (Signed)
The patient has left hand weakness and  At complaints of parathesias in her left hand.  I will asked him to followup with Dr. Sharene Skeans and his team whom they have seen in the past. I will send a copy of this note to them

## 2013-06-22 NOTE — Assessment & Plan Note (Signed)
As above.

## 2013-06-22 NOTE — Assessment & Plan Note (Signed)
improved

## 2013-06-22 NOTE — Progress Notes (Signed)
Patient Care Team: Candace Darolyn Rua, MD as PCP - General (Family Medicine)   HPI  Stephanie Hudson is a 22 y.o. female Seen in followup for palpitations and exercise intolerance. He has an elevated blood pressure.  She was being treated with volume repletion and salt with some improvement  She's been much improved. This may relate to change in her birth control to the elimination of periods   Urine catecholamines neg ;  She has complaints of tingling in her left and is more prominent over months now lasting hours. She's had more headaches, albeit left sided  also hx of migraines    Past Medical History  Diagnosis Date  . Palpitations   . Asthma   . Dysmenorrhea   . Fatigue   . Anemia   . Scoliosis   . Chronic headaches   . Dyspnea   . Hx of menorrhagia   . Headache(784.0)   . Low iron   . History of chicken pox     Past Surgical History  Procedure Laterality Date  . Tonsillectomy    . Tubes      In ears as baby    Current Outpatient Prescriptions  Medication Sig Dispense Refill  . albuterol (PROVENTIL HFA;VENTOLIN HFA) 108 (90 BASE) MCG/ACT inhaler Inhale 2 puffs into the lungs every 6 (six) hours as needed.        Marland Kitchen EPINEPHrine (EPIPEN 2-PAK IJ) Inject as directed.      . IRON PO Take by mouth daily. 1-2 tablets.      Marland Kitchen levonorgestrel-ethinyl estradiol (AMETHYST) 90-20 MCG tablet Take 1 tablet by mouth daily.       No current facility-administered medications for this visit.    Allergies  Allergen Reactions  . Latex     Review of Systems negative except from HPI and PMH  Physical Exam BP 97/60  Pulse 76  Ht 5\' 7"  (1.702 m)  Wt 146 lb (66.225 kg)  BMI 22.86 kg/m2 Well developed and well nourished in no acute distress HENT normal E scleral and icterus clear Neck Supple JVP flat; carotids brisk and full Clear to ausculation  Regular rate and rhythm, no murmurs gallops or rub Soft with active bowel sounds No clubbing cyanosis none Edema Alert and  oriented, there is some left-sided weakness with finger thumb opposition, and even at elbow flexion Skin warm  ECG normal  Assessment and  Plan

## 2013-06-29 ENCOUNTER — Encounter: Payer: Self-pay | Admitting: Family

## 2013-06-29 ENCOUNTER — Ambulatory Visit (INDEPENDENT_AMBULATORY_CARE_PROVIDER_SITE_OTHER): Payer: 59 | Admitting: Family

## 2013-06-29 ENCOUNTER — Ambulatory Visit: Payer: Self-pay | Admitting: Family

## 2013-06-29 VITALS — BP 100/70 | HR 70 | Ht 67.0 in | Wt 142.2 lb

## 2013-06-29 DIAGNOSIS — G901 Familial dysautonomia [Riley-Day]: Secondary | ICD-10-CM

## 2013-06-29 DIAGNOSIS — R51 Headache: Secondary | ICD-10-CM

## 2013-06-29 DIAGNOSIS — G44219 Episodic tension-type headache, not intractable: Secondary | ICD-10-CM | POA: Insufficient documentation

## 2013-06-29 DIAGNOSIS — R202 Paresthesia of skin: Secondary | ICD-10-CM

## 2013-06-29 DIAGNOSIS — M6281 Muscle weakness (generalized): Secondary | ICD-10-CM

## 2013-06-29 DIAGNOSIS — D649 Anemia, unspecified: Secondary | ICD-10-CM

## 2013-06-29 DIAGNOSIS — R002 Palpitations: Secondary | ICD-10-CM

## 2013-06-29 DIAGNOSIS — H9190 Unspecified hearing loss, unspecified ear: Secondary | ICD-10-CM | POA: Insufficient documentation

## 2013-06-29 DIAGNOSIS — R209 Unspecified disturbances of skin sensation: Secondary | ICD-10-CM

## 2013-06-29 DIAGNOSIS — R29898 Other symptoms and signs involving the musculoskeletal system: Secondary | ICD-10-CM

## 2013-06-29 DIAGNOSIS — G909 Disorder of the autonomic nervous system, unspecified: Secondary | ICD-10-CM

## 2013-06-29 DIAGNOSIS — G43009 Migraine without aura, not intractable, without status migrainosus: Secondary | ICD-10-CM

## 2013-06-29 MED ORDER — PROPRANOLOL HCL 10 MG PO TABS: ORAL_TABLET | ORAL | Status: DC

## 2013-06-29 MED ORDER — NAPROXEN 500 MG PO TABS: ORAL_TABLET | ORAL | Status: DC

## 2013-06-29 NOTE — Progress Notes (Signed)
Patient: Stephanie Hudson MRN: [id number] Sex: female DOB: 02-29-1991  Provider: [name], NP Location of Care: [facility]  Note type: Routine return visit  History of Present Illness: Referral Source: Dr. Merri Brunette History from: patient Chief Complaint: Left Sided Numbness and Headaches  [name] is a 22 y.o. female with history of tension and migraine headaches. She used to take Amitriptyline but tapered herself off Amitriptyline when she entered college. She did not note increase in migraines. She also has Postural Orthostatic Tachycardia Syndrome (POTS) and used to take Atenolol, but tapered herself off that during the winter of 2014 because she was very fatigued and sleeping excessively. She says that she felt better after coming off the Atenolol and had more energy.  When she has a particularly severe migraine, she will take a half tablet of Percocet. This usually helps to dull the pain and help her get to sleep.  She has some nausea with the migraines and has a prescription for Zofran to use if needed.   Stephanie Hudson is seen today because of increase in headaches as well as complaints of tingling in her fingers in her left hand as well as weakness in her left hand. She tells me that the increase in headaches began in June. 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 She also took 2 online classes this summer. She says that she had a very stressful summer this year because of the online classes and because of the classes with a supervisor at the camp. She told me about an incident in which she believes that she had a panic attack when she had a paper due for a class and could not get the Internet to work while at the same time she was upset over ruining a favor pair of slacks. She describes the headaches as beginning as bitemporal pain, then spreading across her forehead. The pain usually begins around lunch time and  continues all afternoon. The pain is a pressure sensation at first, then becomes more of a throbbing sensation and more localized to her left eye as the day goes on. She has not had to take Percocet to obtain relief for any of the headaches but has taken Tylenol Extra Strength so that she could get to sleep. She returned home from her job at the camp on July 31st but the headaches have continued. She is worried about the upcoming school year because she is facing a difficult senior year in college. She will have 18 hours of course work this fall. She is concerned that her major will not one that will be one that will lead her to a job after graduation next year.   She also noted tingling in her finger tips of her left hand this summer. She said that it started in the 3rd finger but then gradually spread to the other fingers. She has had no pain, only tingling. When she saw her cardiologist, Dr. Graciela Husbands, he noted that her left hand was weak and recommended that she follow up at this office.   Review of Systems: 12 system review was remarkable for palpitations, hearing loss, short of breath, headache and numbness  Past Medical History  Diagnosis Date  . Palpitations   . Asthma   . Dysmenorrhea   . Fatigue   . Anemia   . Scoliosis   . Chronic headaches   . Dyspnea   . Hx of menorrhagia   .  Headache(784.0)   . Low iron   . History of chicken pox   . Concussion May 2002   Hospitalizations: no, Head Injury: yes, Nervous System Infections: no, Immunizations up to date: yes   Surgical History Past Surgical History  Procedure Laterality Date  . Tonsillectomy and adenoidectomy  April 20, 2004  . Tubes      In ears as baby  . Wisdom tooth extraction  May 24, 2011     Family History family history includes Cancer - Other in her maternal grandfather; Stroke in her paternal grandfather. Family History is negative migraines, seizures, cognitive impairment, blindness, deafness, birth defects,  chromosomal disorder, autism.  Social History History   Social History  . Marital Status: Single    Spouse Name: N/A    Number of Children: N/A  . Years of Education: N/A   Social History Main Topics  . Smoking status: Never Smoker   . Smokeless tobacco: Never Used  . Alcohol Use: No  . Drug Use: No  . Sexual Activity: None   Other Topics Concern  . None   Social History Narrative  . None   Educational level: senior at [employer] Attending: [employer] Occupation: Student  Living with parents and older sister until she returns to school.  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  Current Outpatient Prescriptions on File Prior to Visit  Medication Sig Dispense Refill  . albuterol (PROVENTIL HFA;VENTOLIN HFA) 108 (90 BASE) MCG/ACT inhaler Inhale 2 puffs into the lungs every 6 (six) hours as needed.        Marland Kitchen EPINEPHrine (EPIPEN 2-PAK IJ) Inject as directed.      . IRON PO Take by mouth daily. 1-2 tablets.      Marland Kitchen levonorgestrel-ethinyl estradiol (AMETHYST) 90-20 MCG tablet Take 1 tablet by mouth daily.       No current facility-administered medications on file prior to visit.   The medication list was reviewed and reconciled. All changes or newly prescribed medications were explained.  A complete medication list was provided to the patient/caregiver.  Allergies  Allergen Reactions  . Latex     Physical Exam BP 100/70  Pulse 70  Ht 5\' 7"  (1.702 m)  Wt 142 lb 3.2 oz (64.501 kg)  BMI 22.27 kg/m2 General: well developed, well nourished female, seated on exam table, in no evident distress Head: head  normocephalic and atraumatic.   Ears, Nose and Throat: Oropharynx benign Neck: supple with no carotid or supraclavicular bruits. Cardiovascular: regular rate and rhythm, no murmurs Musculoskeletal: mild scoliosis  Neurologic Exam  Mental Status: Awake and fully alert.   Oriented to place and time.  Recent and remote memory intact.  Attention span, concentration, and fund of knowledge appropriate.  Mood and affect appropriate. Cranial Nerves: Fundoscopic exam reveals sharp disc margins.  Pupils equal, briskly reactive to light.  Extraocular movements full without nystagmus.  Visual fields full to confrontation.  Hearing intact and symmetric to normal voice.  Facial sensation intact.  Face, tongue, palate move normally and symmetrically.  Neck flexion and extension normal. Motor: Normal bulk and tone.  Normal strength in all tested extremity muscles except for left hand, which has decreased weakness in fingers and hand grip of left hand. Sensory: Intact to touch and temperature in all extremities. She has normal sensation in her fingers of both hands. Coordination: Rapid alternating movements normal in all extremities.  Finger-to-nose and heel-to-shin  performed accurately bilaterally. Romberg negative. Gait and Station: Arises from chair without difficulty.  Stance is normal.  Gait demonstrates normal stride length and balance.  Able to heel, toe, and tandem walk without difficulty. Reflexes: Diminished and symmetric.  Toes downgoing.  Assessment and Plan Dominika is a 22 year old young woman with history of tension and migraine headaches and Postural Orthostatic Tachycardia Syndrome (POTS). She has new finding of tingling in fingers and weakness of left hand. I consulted with Dr Sharene Skeans regarding this patient. He came in to see Ashlay and her father. He examined her hands and recommended that she have a NCV with EMG to follow if indicated. He talked with Zollie Scale and her father about the likelihood that this represents carpal tunnel syndrome. I talked with her in greater detail and explained the need for her to wear a wrist splint.  Dr Sharene Skeans recommended that she take Naproxen at night for a few weeks to reduce inflammation. I also talked with Westlynn about her headaches. I  recommended that she start a headache diary and start Propranolol at night only. I want her to be on very low dose as she was fatigued on Atenolol, and does not want to restart that. She was also resistant to trying Topiramate or Depakote. We talked about the need for her to manage stress and for her to start her exercise program, as she has not been doing so while working in [place]. I asked her to call me weekly to report on her condition. I will adjust her Propranolol dose as her headaches indicate or as she has side effects. I asked her to return for follow up in 2 months or sooner if needed. She and her father agreed with these plans.

## 2013-06-29 NOTE — Patient Instructions (Addendum)
Start Propranolol 10mg  at bedtime.  Call or email me in a week to let me know how the headaches are doing.  Keep a headache diary so that we can see if your headaches are improving.  Start an exercise program. Consider yoga to help with stress management.  We will set you up for NCV and EMG study of left hand. I will call you when I have the results.  Get a rigid wrist splint and wear it on left hand at all times except when bathing and swimming.  Take Naproxen 500mg  - 1 tablet at bedtime with food.

## 2013-07-22 ENCOUNTER — Ambulatory Visit (INDEPENDENT_AMBULATORY_CARE_PROVIDER_SITE_OTHER): Payer: Self-pay

## 2013-07-22 ENCOUNTER — Ambulatory Visit (INDEPENDENT_AMBULATORY_CARE_PROVIDER_SITE_OTHER): Payer: 59 | Admitting: Neurology

## 2013-07-22 DIAGNOSIS — R29898 Other symptoms and signs involving the musculoskeletal system: Secondary | ICD-10-CM

## 2013-07-22 DIAGNOSIS — Z0289 Encounter for other administrative examinations: Secondary | ICD-10-CM

## 2013-07-22 DIAGNOSIS — R209 Unspecified disturbances of skin sensation: Secondary | ICD-10-CM

## 2013-07-22 DIAGNOSIS — R202 Paresthesia of skin: Secondary | ICD-10-CM

## 2013-07-22 NOTE — Procedures (Signed)
  HISTORY:  Stephanie Hudson is a 22 year old patient with a two-year history of intermittent numbness and tingling sensation of the left hand. Over time, these episodes have become more frequent and more prolonged. The patient is felt to have some mild weakness of the left arm. The patient is being evaluated for a possible neuropathy or a cervical radiculopathy. The patient denies any neck or shoulder discomfort.  NERVE CONDUCTION STUDIES:  Nerve conduction studies were performed on both upper extremities. The distal motor latencies and motor amplitudes for the median and ulnar nerves were within normal limits. The F wave latencies and nerve conduction velocities for these nerves were also normal. The sensory latencies for the median and ulnar nerves were normal.   EMG STUDIES:  EMG study was performed on the left upper extremity:  The first dorsal interosseous muscle reveals 2 to 4 K units with full recruitment. No fibrillations or positive waves were noted. The abductor pollicis brevis muscle reveals 2 to 4 K units with full recruitment. No fibrillations or positive waves were noted. The extensor indicis proprius muscle reveals 1 to 3 K units with full recruitment. No fibrillations or positive waves were noted. The pronator teres muscle reveals 2 to 3 K units with full recruitment. No fibrillations or positive waves were noted. The biceps muscle reveals 1 to 2 K units with full recruitment. No fibrillations or positive waves were noted. The triceps muscle reveals 2 to 4 K units with full recruitment. No fibrillations or positive waves were noted. The anterior deltoid muscle reveals 2 to 3 K units with full recruitment. No fibrillations or positive waves were noted. The cervical paraspinal muscles were tested at 2 levels. No abnormalities of insertional activity were seen at either level tested. There was good relaxation.   IMPRESSION:  Nerve conduction studies done on both upper extremities  were within normal limits. No evidence of a neuropathy is seen. EMG evaluation of the left upper extremity is normal. There is no evidence of an overlying cervical radiculopathy.  Marlan Palau MD 07/22/2013 3:59 PM  [facility] [address] [place], [place] [address]  Phone [phone] Fax [phone]

## 2013-07-23 ENCOUNTER — Telehealth: Payer: Self-pay | Admitting: Pediatrics

## 2013-07-23 NOTE — Telephone Encounter (Signed)
The conduction studies and EMGs were normal.  We don't have a electrodiagnostic reason for the patient's numbness and pain.

## 2013-07-23 NOTE — Telephone Encounter (Signed)
Message copied by Deetta Perla on Thu Jul 23, 2013  6:05 PM ------      Message from: Princella Ion      Created: Wed Jul 22, 2013  4:28 PM                   ----- Message -----         From: York Spaniel, MD         Sent: 07/22/2013   4:04 PM           To: [name], NP             ------

## 2013-08-31 ENCOUNTER — Ambulatory Visit (INDEPENDENT_AMBULATORY_CARE_PROVIDER_SITE_OTHER): Payer: 59 | Admitting: Family

## 2013-08-31 ENCOUNTER — Encounter: Payer: Self-pay | Admitting: Family

## 2013-08-31 VITALS — BP 102/74 | HR 72 | Ht 67.0 in | Wt 140.8 lb

## 2013-08-31 DIAGNOSIS — R202 Paresthesia of skin: Secondary | ICD-10-CM

## 2013-08-31 DIAGNOSIS — R51 Headache: Secondary | ICD-10-CM

## 2013-08-31 DIAGNOSIS — G43009 Migraine without aura, not intractable, without status migrainosus: Secondary | ICD-10-CM

## 2013-08-31 DIAGNOSIS — G901 Familial dysautonomia [Riley-Day]: Secondary | ICD-10-CM

## 2013-08-31 DIAGNOSIS — R29898 Other symptoms and signs involving the musculoskeletal system: Secondary | ICD-10-CM

## 2013-08-31 DIAGNOSIS — M6281 Muscle weakness (generalized): Secondary | ICD-10-CM

## 2013-08-31 DIAGNOSIS — R209 Unspecified disturbances of skin sensation: Secondary | ICD-10-CM

## 2013-08-31 DIAGNOSIS — G909 Disorder of the autonomic nervous system, unspecified: Secondary | ICD-10-CM

## 2013-08-31 MED ORDER — ONDANSETRON HCL 4 MG PO TABS: 4.0000 mg | ORAL_TABLET | Freq: Three times a day (TID) | ORAL | Status: DC | PRN

## 2013-08-31 NOTE — Progress Notes (Signed)
Patient: Stephanie Hudson MRN: [id number] Sex: female DOB: 28-Apr-1991  Provider: [name], NP Location of Care: [facility] Child Neurology  Note type: Routine return visit  History of Present Illness: Referral Source: Dr. Merri Brunette History from: patient Chief Complaint: Headaches  Stephanie Hudson is a 22 y.o. female with history of tension and migraine headaches. She used to take Amitriptyline but tapered herself off Amitriptyline when she entered college. She did not note increase in migraines. She also has Postural Orthostatic Tachycardia Syndrome (POTS) and used to take Atenolol, but tapered herself off that during the winter of 2014 because she was very fatigued and sleeping excessively. She says that she felt better after coming off the Atenolol and had more energy. When she has a particularly severe migraine, she will take a half tablet of Percocet. This usually helps to dull the pain and help her get to sleep. She has some nausea with the migraines and has a prescription for Zofran to use if needed.   When Stephanie Hudson was last seen in August, she was experiencing increase in headaches. She reports today that her stress has diminished and her headaches have improved.    Stephanie Hudson was also experiencing tingling in the fingertips of her left hand and weakness in the fingers of her left hand. She had an NCV/EMG study that was normal in September. She reports today that the tingling and weakness is unchanged.  Review of Systems: 12 system review was unremarkable  Past Medical History  Diagnosis Date  . Palpitations   . Asthma   . Dysmenorrhea   . Fatigue   . Anemia   . Scoliosis   . Chronic headaches   . Dyspnea   . Hx of menorrhagia   . Headache(784.0)   . Low iron   . History of chicken pox   . Concussion May 2002   Hospitalizations: no, Head Injury: no, Nervous System Infections: no, Immunizations up to date: yes Past Medical History Comments: Upper extremity NCV/EMG on  07/22/13 was normal  Surgical History Past Surgical History  Procedure Laterality Date  . Tonsillectomy and adenoidectomy  April 20, 2004  . Tubes      In ears as baby  . Wisdom tooth extraction  May 24, 2011    Family History family history includes Cancer - Other in her maternal grandfather; Stroke in her paternal grandfather. Family History is negative migraines, seizures, cognitive impairment, blindness, deafness, birth defects, chromosomal disorder, autism.  Social History History   Social History  . Marital Status: Single    Spouse Name: N/A    Number of Children: N/A  . Years of Education: N/A   Social History Main Topics  . Smoking status: Never Smoker   . Smokeless tobacco: Never Used  . Alcohol Use: No  . Drug Use: No  . Sexual Activity: No   Other Topics Concern  . None   Social History Narrative  . 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  Current Outpatient Prescriptions on File Prior to Visit  Medication Sig Dispense Refill  . albuterol (PROVENTIL HFA;VENTOLIN HFA) 108 (90 BASE) MCG/ACT inhaler Inhale 2 puffs into the lungs every 6 (six) hours as needed.        Marland Kitchen EPINEPHrine (EPIPEN 2-PAK IJ) Inject as directed.      Marland Kitchen levonorgestrel-ethinyl  estradiol (AMETHYST) 90-20 MCG tablet Take 1 tablet by mouth daily.      . IRON PO Take by mouth daily. 1-2 tablets.      . naproxen (NAPROSYN) 500 MG tablet Take 1 tablet at bedtime with food  30 tablet  2  . propranolol (INDERAL) 10 MG tablet Take 1 tablet at bedtime for 1 week, then increase to 1 tablet twice per day  60 tablet  5   No current facility-administered medications on file prior to visit.   The medication list was reviewed and reconciled. All changes or newly prescribed medications were explained.  A complete  medication list was provided to the patient/caregiver.  Allergies  Allergen Reactions  . Latex Anaphylaxis    Physical Exam BP 102/74  Pulse 72  Ht 5\' 7"  (1.702 m)  Wt 140 lb 12.8 oz (63.866 kg)  BMI 22.05 kg/m2 General: well developed, well nourished female, seated on exam table, in no evident distress  Head: head normocephalic and atraumatic.  Ears, Nose and Throat: Oropharynx benign  Neck: supple with no carotid or supraclavicular bruits.  Cardiovascular: regular rate and rhythm, no murmurs  Musculoskeletal: mild scoliosis  Neurologic Exam  Mental Status: Awake and fully alert. Oriented to place and time. Recent and remote memory intact. Attention span, concentration, and fund of knowledge appropriate. Mood and affect appropriate.  Cranial Nerves: Fundoscopic exam reveals sharp disc margins. Pupils equal, briskly reactive to light. Extraocular movements full without nystagmus. Visual fields full to confrontation. Hearing intact and symmetric to normal voice. Facial sensation intact. Face, tongue, palate move normally and symmetrically. Neck flexion and extension normal.  Motor: Normal bulk and tone. Normal strength in all tested extremity muscles except for left hand, which has decreased weakness in fingers and hand grip of left hand.  Sensory: Intact to touch and temperature in all extremities. She has normal sensation in her fingers of both hands.  Coordination: Rapid alternating movements normal in all extremities. Finger-to-nose and heel-to-shin performed accurately bilaterally. Romberg negative.  Gait and Station: Arises from chair without difficulty. Stance is normal. Gait demonstrates normal stride length and balance. Able to heel, toe, and tandem walk without difficulty.  Reflexes: Diminished and symmetric. Toes downgoing.  Assessment and Plan Stephanie Hudson is a 22 year old young woman with history of tension and migraine headaches and Postural Orthostatic Tachycardia Syndrome  (POTS). When she was last seen, she was having increase in headaches but that has fortunately improved. She has mild tingling and weakness in the fingers of her left hand that is unchanged. NCV/EMG study was normal. We will continue to monitor her condition for now. Stephanie Hudson knows to call if there is any worsening in the tingling or weakness.

## 2013-08-31 NOTE — Patient Instructions (Addendum)
Let me know if your headaches increase in frequency or intensity.  Let me know if the tingling or weakness in your fingers worsens.  Please plan to return for follow up in 4 months or sooner if needed.

## 2013-11-24 ENCOUNTER — Encounter: Payer: Self-pay | Admitting: *Deleted

## 2014-01-01 ENCOUNTER — Encounter: Payer: Self-pay | Admitting: Family

## 2014-01-01 ENCOUNTER — Ambulatory Visit (INDEPENDENT_AMBULATORY_CARE_PROVIDER_SITE_OTHER): Payer: 59 | Admitting: Family

## 2014-01-01 VITALS — BP 106/70 | HR 74 | Ht 67.0 in | Wt 137.2 lb

## 2014-01-01 DIAGNOSIS — M6281 Muscle weakness (generalized): Secondary | ICD-10-CM

## 2014-01-01 DIAGNOSIS — R519 Headache, unspecified: Secondary | ICD-10-CM

## 2014-01-01 DIAGNOSIS — G901 Familial dysautonomia [Riley-Day]: Secondary | ICD-10-CM

## 2014-01-01 DIAGNOSIS — G909 Disorder of the autonomic nervous system, unspecified: Secondary | ICD-10-CM

## 2014-01-01 DIAGNOSIS — R202 Paresthesia of skin: Secondary | ICD-10-CM

## 2014-01-01 DIAGNOSIS — G43009 Migraine without aura, not intractable, without status migrainosus: Secondary | ICD-10-CM

## 2014-01-01 DIAGNOSIS — R002 Palpitations: Secondary | ICD-10-CM

## 2014-01-01 DIAGNOSIS — H9190 Unspecified hearing loss, unspecified ear: Secondary | ICD-10-CM

## 2014-01-01 DIAGNOSIS — R51 Headache: Secondary | ICD-10-CM

## 2014-01-01 DIAGNOSIS — R29898 Other symptoms and signs involving the musculoskeletal system: Secondary | ICD-10-CM

## 2014-01-01 DIAGNOSIS — R209 Unspecified disturbances of skin sensation: Secondary | ICD-10-CM

## 2014-01-01 NOTE — Progress Notes (Signed)
Patient: Stephanie Hudson MRN: [id number] Sex: female DOB: August 09, 1991  Provider: [name], NP Location of Care: [facility] Child Neurology  Note type: Routine return visit  History of Present Illness: Referral Source: Dr. Merri Brunette History from: patient Chief Complaint: Headaches  Stephanie Hudson is a 23 y.o. with history of tension and migraine headaches. She has taken Amitriptyline and Atenolol in the past for migraine prevention. When Stephanie Hudson has a particularly severe migraine, she will take a half tablet of Percocet. This usually helps to dull the pain and help her get to sleep. She has some nausea with the migraines and has a prescription for Zofran to use if needed. Stephanie Hudson says that since she was last seen, she has had only 1 or 2 migraines. She finds that stress and poor sleep can trigger headaches.   She also has Postural Orthostatic Tachycardia Syndrome (POTS) and is followed by a cardiologist. Stephanie Hudson has also had tingling in the fingertips of her left hand and weakness in the fingers of her left hand. She had an NCV/EMG study that was normal in September 2014. She continues to have intermittent tingling and weakness in her hand but says it is not problematic for her. She notices most when driving.   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 She is also engaged to be married but says that they will not set a date until she and her fiance are both employed. Stephanie Hudson is considering enrolling in an [facility] program but has not decided if she wants to do that at this time.   Review of Systems: 12 system review was unremarkable  Past Medical History  Diagnosis Date  . Palpitations   . Asthma   . Dysmenorrhea   . Fatigue   . Anemia   . Scoliosis   . Chronic headaches   . Dyspnea   . Hx of menorrhagia   . Headache(784.0)   . Low iron   . History of chicken pox   . Concussion May 2002   Hospitalizations: yes, Head Injury: yes, Nervous System Infections:  no, Immunizations up to date: yes Past Medical History Comments: She had a concussion in 03/14/2002 and was hospitalized for 2 nights. She also was hospitalized in 2006 for one night for removal of her tonsils and adenoids. Upper extremity NCV/EMG on 07/22/13 was normal.   Surgical History Past Surgical History  Procedure Laterality Date  . Tonsillectomy and adenoidectomy  April 20, 2004  . Tubes      In ears as baby  . Wisdom tooth extraction  May 24, 2011    Family History family history includes Cancer - Other in her maternal grandfather; Stroke in her paternal grandfather. Family History is otherwise negative for migraines, seizures, cognitive impairment, blindness, deafness, birth defects, chromosomal disorder, autism.  Social History History   Social History  . Marital Status: Single    Spouse Name: N/A    Number of Children: N/A  . Years of Education: N/A   Social History Main Topics  . Smoking status: Never Smoker   . Smokeless tobacco: Never Used  . Alcohol Use: No  . Drug Use: No  . Sexual Activity: No   Other Topics Concern  . None   Social History Narrative  . None   Educational level: university School Attending: [employer] Living with:  roommate  Hobbies/Interest: Secretary/administrator and sports School comments:  Stephanie Hudson graduated from [employer] in 2011. 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  Ears, Nose and Throat: Oropharynx benign  Neck: supple with no carotid or supraclavicular bruits.  Cardiovascular: regular rate and rhythm, no murmurs  Musculoskeletal: mild scoliosis   Neurologic Exam   Mental Status: Awake and fully alert. Oriented to place and time. Recent and remote memory intact. Attention span, concentration, and fund of knowledge appropriate. Mood and affect appropriate.  Cranial Nerves: Fundoscopic exam reveals sharp disc margins. Pupils equal, briskly reactive to light. Extraocular movements full without nystagmus. Visual fields full to confrontation. Hearing intact and symmetric to normal voice. Facial sensation intact. Face, tongue, palate move normally and symmetrically. Neck flexion and extension normal.  Motor: Normal bulk and tone. Normal strength in all tested extremity muscles except for left hand, which has decreased weakness in fingers and hand grip of left hand.  Sensory: Intact to touch and temperature in all extremities. She has normal sensation in her fingers of both hands.  Coordination: Rapid alternating movements normal in all extremities. Finger-to-nose and heel-to-shin performed accurately bilaterally. Romberg negative.  Gait and Station: Arises from chair without difficulty. Stance is normal. Gait demonstrates normal stride length and balance. Able to heel, toe, and tandem walk without difficulty.  Reflexes: Diminished and symmetric. Toes downgoing.   Assessment and Plan Stephanie ScaleOlivia is a 23 year old young woman with history of tension and migraine headaches and Postural Orthostatic Tachycardia Syndrome (POTS). Her headaches have not been problematic since she was last seen. She has mild tingling and weakness in the fingers of her left hand that is unchanged. NCV/EMG study was normal. We will continue to monitor her condition for now. She will return for follow up in 6 months or sooner if needed.

## 2014-01-03 ENCOUNTER — Encounter: Payer: Self-pay | Admitting: Family

## 2014-01-03 NOTE — Patient Instructions (Signed)
Continue your medications without change. Call me if your headaches worsen.  Follow up with your cardiologist as we discussed.  Please follow up in 6 months or sooner if needed.

## 2014-08-04 ENCOUNTER — Ambulatory Visit (INDEPENDENT_AMBULATORY_CARE_PROVIDER_SITE_OTHER): Payer: 59 | Admitting: Family

## 2014-08-04 ENCOUNTER — Encounter: Payer: Self-pay | Admitting: Family

## 2014-08-04 VITALS — BP 110/70 | HR 78 | Ht 67.25 in | Wt 143.8 lb

## 2014-08-04 DIAGNOSIS — H9190 Unspecified hearing loss, unspecified ear: Secondary | ICD-10-CM

## 2014-08-04 DIAGNOSIS — G43009 Migraine without aura, not intractable, without status migrainosus: Secondary | ICD-10-CM

## 2014-08-04 DIAGNOSIS — R519 Headache, unspecified: Secondary | ICD-10-CM

## 2014-08-04 DIAGNOSIS — R51 Headache: Secondary | ICD-10-CM

## 2014-08-04 NOTE — Progress Notes (Signed)
Patient: Stephanie Hudson MRN: [id number] Sex: female DOB: [birth year]  Provider: [name], NP Location of Care: [facility] Child Neurology  Note type: Routine return visit  History of Present Illness: Referral Source: Dr. Merri Brunette History from: patient Chief Complaint: Headaches  Stephanie Hudson is a 23 y.o. young woman with history of tension and migraine headaches. She was last seen January 03, 2014. Stephanie Hudson has taken Amitriptyline and Atenolol in the past for migraine prevention. When Stephanie Hudson has a particularly severe migraine, she will take a half tablet of Percocet. This usually helps to dull the pain and help her get to sleep. She has some nausea with the migraines and has a prescription for Zofran to use if needed.   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 She says that she is working a lot of hours per week, and has been tired. She has had some increase in headaches due to stress and tension. She said that some of her work related stress is improving as she has been already received a promotion as her employer has seen that she is capable of the work that she was hired to do. She is having some personal stress as she has had to live with her parents because although she is working, her salary is low. In addition, her boyfriend who also graduated is still unemployed, and [name] will not commit to an engagement until they are both gainfully employed. Stephanie Hudson is still considering enrolling in an [facility] program.   Stephanie Hudson also has Postural Orthostatic Tachycardia Syndrome (POTS) and is followed by a cardiologist. She said that she has not been experiencing symptoms of POTS recently. She has been exercising by going to a dance class and has tolerated it well. Stephanie Hudson has also had tingling in the fingertips of her left hand and weakness in the fingers of her left hand. She had an NCV/EMG study that was normal in September  2014. She continues to have intermittent tingling and weakness in her hand but says it is not problematic for her. She notices most when driving.  Review of Systems: 12 system review was remarkable for headaches  Past Medical History  Diagnosis Date  . Palpitations   . Asthma   . Dysmenorrhea   . Fatigue   . Anemia   . Scoliosis   . Chronic headaches   . Dyspnea   . Hx of menorrhagia   . Headache(784.0)   . Low iron   . History of chicken pox   . Concussion May 2002   Hospitalizations: No., Head Injury: No., Nervous System Infections: No., Immunizations up to date: Yes.   Past Medical History Comments: She had a concussion in 03/14/2002 and was hospitalized for 2 nights. She also was hospitalized in 2006 for one night for removal of her tonsils and adenoids. Upper extremity NCV/EMG on 07/22/13 was normal.   Surgical History Past Surgical History  Procedure Laterality Date  . Tonsillectomy and adenoidectomy  April 20, 2004  . Tubes      In ears as baby  . Wisdom tooth extraction  May 24, 2011    Family History family history includes Cancer - Other in her maternal grandfather; Stroke in her paternal grandfather. Family History is otherwise negative for migraines, seizures, cognitive impairment, blindness, deafness, birth defects, chromosomal disorder, autism.  Social History History   Social History  . Marital Status: Single    Spouse Name:  N/A    Number of Children: N/A  . Years of Education: N/A   Social History Main Topics  . Smoking status: Never Smoker   . Smokeless tobacco: Never Used  . Alcohol Use: No  . Drug Use: No  . Sexual Activity: No   Other Topics Concern  . None   Social History Narrative  . None   Educational level: university School Attending:N/A Living with:  both parents  Hobbies/Interest: politics and watching sports School comments:  [name] received a Oncologist from [employer] in 2015. She is currently working at [employer] as an Therapist, occupational.  Physical Exam BP 110/70  Pulse 78  Ht 5' 7.25" (1.708 m)  Wt 143 lb 12.8 oz (65.227 kg)  BMI 22.36 kg/m2 General: well developed, well nourished female, seated on exam table, in no evident distress  Head: head normocephalic and atraumatic.  Ears, Nose and Throat: Oropharynx benign  Neck: supple with no carotid or supraclavicular bruits.  Cardiovascular: regular rate and rhythm, no murmurs  Musculoskeletal: mild scoliosis   Neurologic Exam  Mental Status: Awake and fully alert. Oriented to place and time. Recent and remote memory intact. Attention span, concentration, and fund of knowledge appropriate. Mood and affect appropriate.  Cranial Nerves: Fundoscopic exam reveals sharp disc margins. Pupils equal, briskly reactive to light. Extraocular movements full without nystagmus. Visual fields full to confrontation. Hearing intact and symmetric to normal voice. Facial sensation intact. Face, tongue, palate move normally and symmetrically. Neck flexion and extension normal.  Motor: Normal bulk and tone. Normal strength in all tested extremity muscles except for left hand, which has decreased weakness in fingers and hand grip of left hand.  Sensory: Intact to touch and temperature in all extremities. She has normal sensation in her fingers of both hands.  Coordination: Rapid alternating movements normal in all extremities. Finger-to-nose and heel-to-shin performed accurately bilaterally. Romberg negative.  Gait and Station: Arises from chair without difficulty. Stance is normal. Gait demonstrates normal stride length and balance. Able to heel, toe, and tandem walk without difficulty.  Reflexes: Diminished and symmetric. Toes downgoing.  Assessment and Plan Stephanie Hudson is a 36 year old young woman with history of tension and migraine headaches and Postural Orthostatic Tachycardia Syndrome (POTS). She had graduated from college and is working at [employer],  and has had some increase in headaches due to stress. We talked about ways to manage stress and take care of herself as she continues to transition to her new role. She is considering returning to school to complete an [facility] program, but is undecided. She will return for follow up in 6 months or sooner if needed. We will likely help her transition to an adult neurology practice at that point if she decides against returning to school. Kahdijah agrees with these plans.

## 2014-08-04 NOTE — Patient Instructions (Signed)
Continue your medications without change for now. Remember to work on stress management and making yourself a priority as we discussed.  I will see you in follow up in 6 months or sooner if needed.

## 2014-09-02 ENCOUNTER — Encounter: Payer: Self-pay | Admitting: Internal Medicine

## 2014-09-02 ENCOUNTER — Ambulatory Visit (INDEPENDENT_AMBULATORY_CARE_PROVIDER_SITE_OTHER): Payer: 59 | Admitting: Internal Medicine

## 2014-09-02 VITALS — BP 117/68 | HR 76 | Ht 67.0 in | Wt 144.6 lb

## 2014-09-02 DIAGNOSIS — G909 Disorder of the autonomic nervous system, unspecified: Secondary | ICD-10-CM

## 2014-09-02 DIAGNOSIS — D509 Iron deficiency anemia, unspecified: Secondary | ICD-10-CM

## 2014-09-02 DIAGNOSIS — G901 Familial dysautonomia [Riley-Day]: Secondary | ICD-10-CM

## 2014-09-02 LAB — CBC WITH DIFFERENTIAL/PLATELET
Basophils Absolute: 0 10*3/uL (ref 0.0–0.1)
Basophils Relative: 0.4 % (ref 0.0–3.0)
EOS PCT: 0.9 % (ref 0.0–5.0)
Eosinophils Absolute: 0.1 10*3/uL (ref 0.0–0.7)
HCT: 39.2 % (ref 36.0–46.0)
HEMOGLOBIN: 13 g/dL (ref 12.0–15.0)
Lymphocytes Relative: 28.1 % (ref 12.0–46.0)
Lymphs Abs: 2.4 10*3/uL (ref 0.7–4.0)
MCHC: 33.3 g/dL (ref 30.0–36.0)
MCV: 95.6 fl (ref 78.0–100.0)
MONO ABS: 0.6 10*3/uL (ref 0.1–1.0)
MONOS PCT: 6.9 % (ref 3.0–12.0)
NEUTROS ABS: 5.4 10*3/uL (ref 1.4–7.7)
Neutrophils Relative %: 63.7 % (ref 43.0–77.0)
Platelets: 323 10*3/uL (ref 150.0–400.0)
RBC: 4.1 Mil/uL (ref 3.87–5.11)
RDW: 12.8 % (ref 11.5–15.5)
WBC: 8.4 10*3/uL (ref 4.0–10.5)

## 2014-09-02 MED ORDER — METOPROLOL TARTRATE 25 MG PO TABS: 25.0000 mg | ORAL_TABLET | Freq: Two times a day (BID) | ORAL | Status: DC

## 2014-09-02 MED ORDER — PROPRANOLOL HCL ER 60 MG PO CP24: 60.0000 mg | ORAL_CAPSULE | Freq: Every day | ORAL | Status: DC

## 2014-09-02 MED ORDER — METOPROLOL SUCCINATE ER 25 MG PO TB24: 25.0000 mg | ORAL_TABLET | Freq: Every day | ORAL | Status: DC

## 2014-09-02 NOTE — Progress Notes (Signed)
Patient Care Team: Candace Darolyn Ruahiele Smith, MD as PCP - General (Family Medicine) Deetta PerlaWilliam H Hickling, MD as Consulting Physician (Pediatrics)   HPI  Stephanie Hudson is a 23 y.o. female Seen in followup for palpitations and exercise intolerance. She  has an elevated blood pressure.  She was being treated with volume repletion and salt with some improvement  She's been much improved. 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 Palpitations   . Asthma   . Dysmenorrhea   . Fatigue   . Anemia   . Scoliosis   . Chronic headaches   . Dyspnea   . Hx of menorrhagia   . Headache(784.0)   . Low iron   . History of chicken pox   . Concussion May 2002    Past Surgical History  Procedure Laterality Date  . Tonsillectomy and adenoidectomy  April 20, 2004  . Tubes      In ears as baby  . Wisdom tooth extraction  May 24, 2011    Current Outpatient Prescriptions  Medication Sig Dispense Refill  . EPINEPHrine (EPIPEN 2-PAK IJ) Inject as directed. Takes prn      . levonorgestrel-ethinyl estradiol (AMETHYST) 90-20 MCG tablet Take 1 tablet by mouth daily.      . ondansetron (ZOFRAN) 4 MG tablet Take 1 tablet (4 mg total) by mouth every 8 (eight) hours as needed for nausea.  20 tablet  5  . sertraline (ZOLOFT) 25 MG tablet Take 25 mg by mouth daily. Takes 1 tab daily       No current facility-administered medications for this visit.    Allergies  Allergen Reactions  . Latex Anaphylaxis    Review of Systems negative except from HPI and PMH  Physical Exam BP 117/68  Pulse 76  Ht 5\' 7"  (1.702 m)  Wt 144 lb 9.6 oz (65.59 kg)  BMI 22.64 kg/m2 Well developed and well nourished in no acute distress HENT normal E scleral and icterus clear Neck Supple JVP flat; carotids brisk and full Clear  to ausculation  Regular rate and rhythm, no murmurs gallops or rub Soft with active bowel sounds No clubbing cyanosis none Edema Alert and oriented, there is some left-sided weakness with finger thumb opposition, and even at elbow flexion Skin warm  ECG normal  Assessment and  Plan  Postural orthostatic tachycardia/dysautonomia    Overall she is doing pretty well. We have discussed the importance of the value of aerobic exercise this views of the abdominal binder. She is quite replete of salt and water.  She had significant fatigue with atenolol, we will try her on low-dose beta blockers and see if we can attenuate some of her symptomatic postural tachycardia.

## 2014-09-02 NOTE — Patient Instructions (Signed)
Your physician recommends that you have lab work today: cbc/ferritin  Your physician has recommended you make the following change in your medication: You have been given prescriptions for 3 different beta blockers to try. You may take them in any order. DO NOT take them all at the same time. 1) Metoprolol succinate 25 mg one tablet by mouth daily 2) metoprolol tartrate 25 mg one tablet by mouth twice daily 3) Inderal LA 60 mg one tablet by mouth daily.  Your physician recommends that you schedule a follow-up appointment in: 3 months with Dr. Graciela HusbandsKlein.

## 2014-09-03 LAB — FERRITIN: Ferritin: 51.9 ng/mL (ref 10.0–291.0)

## 2014-09-10 ENCOUNTER — Telehealth: Payer: Self-pay | Admitting: Internal Medicine

## 2014-09-10 NOTE — Telephone Encounter (Signed)
Patient informed that all labs were within normal limits.

## 2014-09-10 NOTE — Telephone Encounter (Signed)
New problem   Pt stated she is returning a call from nurse, but no name was given.

## 2014-12-31 ENCOUNTER — Ambulatory Visit (INDEPENDENT_AMBULATORY_CARE_PROVIDER_SITE_OTHER): Payer: 59 | Admitting: Internal Medicine

## 2014-12-31 ENCOUNTER — Encounter: Payer: Self-pay | Admitting: Internal Medicine

## 2014-12-31 VITALS — BP 114/68 | HR 73 | Ht 67.0 in | Wt 142.8 lb

## 2014-12-31 DIAGNOSIS — R000 Tachycardia, unspecified: Secondary | ICD-10-CM

## 2014-12-31 DIAGNOSIS — I951 Orthostatic hypotension: Secondary | ICD-10-CM

## 2014-12-31 DIAGNOSIS — G90A Postural orthostatic tachycardia syndrome (POTS): Secondary | ICD-10-CM

## 2014-12-31 MED ORDER — METOPROLOL SUCCINATE ER 25 MG PO TB24: 25.0000 mg | ORAL_TABLET | Freq: Every day | ORAL | Status: DC

## 2014-12-31 NOTE — Patient Instructions (Addendum)
Your physician recommends that you continue on your current medications as directed. Please refer to the Current Medication list given to you today.  Your physician recommends that you schedule a follow-up appointment in: June/July with Dr. Graciela HusbandsKlein.

## 2014-12-31 NOTE — Progress Notes (Signed)
Patient Care Team: Candace Darolyn Ruahiele Smith, MD as PCP - General (Family Medicine) Deetta PerlaWilliam H Hickling, MD as Consulting Physician (Pediatrics)   HPI  Rolley SimsOlivia R Hudson is a 24 y.o. female Seen in followup for palpitations and exercise intolerance. She  has an elevated blood pressure.  She was being treated with volume repletion and salt with some improvement  She's been much improved. This may relate to change in her birth control to the elimination of periods   She has had more dizziness of late and some DOE   She is working in [employer] and liiving at home; she recently became engaged.   Past Medical History  Diagnosis Date  . Palpitations   . Asthma   . Dysmenorrhea   . Fatigue   . Anemia   . Scoliosis   . Chronic headaches   . Dyspnea   . Hx of menorrhagia   . Headache(784.0)   . Low iron   . History of chicken pox   . Concussion May 2002    Past Surgical History  Procedure Laterality Date  . Tonsillectomy and adenoidectomy  April 20, 2004  . Tubes      In ears as baby  . Wisdom tooth extraction  May 24, 2011    Current Outpatient Prescriptions  Medication Sig Dispense Refill  . EPINEPHrine (EPIPEN 2-PAK IJ) Inject as directed. Takes prn    . metoprolol succinate (TOPROL-XL) 25 MG 24 hr tablet Take 1 tablet (25 mg total) by mouth daily. 30 tablet 0  . norethindrone-ethinyl estradiol (OVCON-35,BALZIVA,BRIELLYN) 0.4-35 MG-MCG tablet Take 1 tablet by mouth daily.    . ondansetron (ZOFRAN) 4 MG tablet Take 1 tablet (4 mg total) by mouth every 8 (eight) hours as needed for nausea. 20 tablet 5  . sertraline (ZOLOFT) 25 MG tablet Take 25 mg by mouth daily. Takes 1 tab daily     No current facility-administered medications for this visit.    Allergies  Allergen Reactions  . Latex Anaphylaxis    Review of Systems negative except from HPI and PMH  Physical Exam BP 114/68 mmHg  Pulse 73  Ht 5\' 7"  (1.702 m)  Wt 142 lb 12.8 oz (64.774 kg)  BMI 22.36 kg/m2 Well developed and  well nourished in no acute distress HENT normal E scleral and icterus clear Neck Supple JVP flat; carotids brisk and full Clear to ausculation  Regular rate and rhythm, no murmurs gallops or rub Soft with active bowel sounds No clubbing cyanosis none Edema Alert and oriented, there is some left-sided weakness with finger thumb opposition, and even at elbow flexion Skin warm  ECG normal  Assessment and  Plan  Postural orthostatic tachycardia/dysautonomia    Overall she is doing pretty well.  She continues to exercise. We discussed again the importance of attention to seek exposure.

## 2015-02-07 ENCOUNTER — Ambulatory Visit (INDEPENDENT_AMBULATORY_CARE_PROVIDER_SITE_OTHER): Payer: 59 | Admitting: Family

## 2015-02-07 VITALS — BP 104/70 | HR 80 | Ht 67.25 in | Wt 144.0 lb

## 2015-02-07 DIAGNOSIS — G44219 Episodic tension-type headache, not intractable: Secondary | ICD-10-CM

## 2015-02-07 DIAGNOSIS — G43009 Migraine without aura, not intractable, without status migrainosus: Secondary | ICD-10-CM

## 2015-02-07 NOTE — Progress Notes (Signed)
Patient: Stephanie SimsOlivia R Hudson MRN: [id number] Sex: female DOB: [birth year]  Provider: Elveria RisingGOODPASTURE, Franciscojavier Wronski, NP [facility]  Note type: Routine return visit  History of Present Illness: Referral Source: Dr. Merri Brunetteandace Smith [facility] Chief Complaint: Headaches  Stephanie SimsOlivia R Hudson is a 24 y.o. young woman with history of tension and migraine headaches. She was last seen August 04, 2014. [name] has taken Amitriptyline and Atenolol in the past for migraine prevention, but is no longer on preventative treatment. When [name] has a particularly severe migraine, she will take a half tablet of Percocet. This usually helps to dull the pain and help her get to sleep. She has some nausea with the migraines and has a prescription for Zofran to use if needed.   Since she was last seen, [name] says that she has had some increase in headaches due to stress and tension. She is working at a job that she does not like, and hopes to find a new job soon. [name] is still considering enrolling in an [employer] program but is now engaged to be married in April 2017, and says that she may wait to return to school until after her wedding. [name] is tired today because she just [place] from [place] a [place] days ago. She was on a mission trip there for 2 weeks, and says that she hasn't gotten her sleep pattern adjusted yet.   [name] also has Postural Orthostatic Tachycardia Syndrome (POTS) and is followed by a cardiologist. She said that she has not been experiencing symptoms of POTS recently. She has been taking Toprol XL and says that she feels much better since being on this medication. She has been exercising and says that she feels quite well on most days.   [name] has been otherwise healthy since last seen. She has no complaints or concerns today.  Review of Systems: Please see the HPI for neurologic and other pertinent review of systems. Otherwise, the following systems are  noncontributory including constitutional, eyes, ears, nose and throat, cardiovascular, respiratory, gastrointestinal, genitourinary, musculoskeletal, skin, endocrine, hematologic/lymph, allergic/immunologic and psychiatric.   Past Medical History  Diagnosis Date  . Palpitations   . Asthma   . Dysmenorrhea   . Fatigue   . Anemia   . Scoliosis   . Chronic headaches   . Dyspnea   . Hx of menorrhagia   . Headache(784.0)   . Low iron   . History of chicken pox   . Concussion May 2002   Hospitalizations: No., Head Injury: Yes.  , Nervous System Infections: No., Immunizations up to date: Yes.   Past Medical History Comments: She had a concussion in 03/14/2002 and was hospitalized for 2 nights. She also was hospitalized in 2006 for one night for removal of her tonsils and adenoids. Upper extremity NCV/EMG was done on 07/22/13 for complaints of intermittent tingling and weakness in her hand, and was normal.   Surgical History Past Surgical History  Procedure Laterality Date  . Tonsillectomy and adenoidectomy  April 20, 2004  . Tubes      In ears as baby  . Wisdom tooth extraction  May 24, 2011    Family History family history includes Cancer - Other in her maternal grandfather; Stroke in her paternal grandfather. Family History is otherwise negative for migraines, seizures, cognitive impairment, blindness, deafness, birth defects, chromosomal disorder, autism.  Social History History   Social History  . Marital Status: Single    Spouse Name: N/A  .  Number of Children: N/A  . Years of Education: N/A   Social History Main Topics  . Smoking status: Never Smoker   . Smokeless tobacco: Never Used  . Alcohol Use: No  . Drug Use: No  . Sexual Activity: No   Other Topics Concern  . 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 Latex Anaphylaxis    Physical Exam BP 104/70 mmHg  Pulse 80  Ht 5' 7.25" (1.708 m)  Wt 144 lb (65.318 kg)  BMI 22.39 kg/m2 General: well developed, well nourished female, seated on exam table, in no evident distress  Head: head normocephalic and atraumatic.  Ears, Nose and Throat: Oropharynx benign  Neck: supple with no carotid or supraclavicular bruits.  Cardiovascular: regular rate and rhythm, no murmurs  Musculoskeletal: mild scoliosis   Neurologic Exam  Mental Status: Awake and fully alert. Oriented to place and time. Recent and remote memory intact. Attention span, concentration, and fund of knowledge appropriate. Mood and affect appropriate.  Cranial Nerves: Fundoscopic exam reveals sharp disc margins. Pupils equal, briskly reactive to light. Extraocular movements full without nystagmus. Visual fields full to confrontation. Hearing intact and symmetric to normal voice. Facial sensation intact. Face, tongue, palate move normally and symmetrically. Neck flexion and extension normal.  Motor: Normal bulk and tone. Normal strength in all tested extremity muscles.  Sensory: Intact to touch and temperature in all extremities. She has normal sensation in her fingers of both hands.  Coordination: Rapid alternating movements normal in all extremities. Finger-to-nose and heel-to-shin performed accurately bilaterally. Romberg negative.  Gait and Station: Arises from chair without difficulty. Stance is normal. Gait demonstrates normal stride length and balance. Able to heel, toe, and tandem walk without difficulty.  Reflexes: Diminished and symmetric. Toes downgoing.  Impression 1. Migraine without aura 2. Episodic tension headaches 3. Postural orthostatic tachycardia syndrome  Recommendations for plan of care The patient's previous [facility] records were reviewed. She has neither had nor required imaging or lab studies since her last visit. Ravon is a 24 year  old young woman with history of tension and migraine headaches and Postural Orthostatic Tachycardia Syndrome (POTS). She reports tension headaches today and few migraines. She is planning a wedding for April 2017 and is considering returning to school to complete an [employer] program, but is undecided. We talked about stress management today and about ways to get her sleep back on schedule while suffering from jet lag. She will return for follow up in 6 months or sooner if needed. We will likely help her transition to an adult neurology practice at that point if she decides against returning to school. She will continue close follow up with her cardiologist for POTS. Zabella agrees with these plans.   The medication list was reviewed and reconciled. There were no changes to her prescribed medications today.  A complete medication list was provided to the patient.  Total time spent with the patient was 20 minutes, of which 50% or more was spent in counseling and coordination of care.

## 2015-02-09 ENCOUNTER — Encounter: Payer: Self-pay | Admitting: Family

## 2015-02-09 NOTE — Patient Instructions (Signed)
Continue your medications as you have been taking them. Let me know if your headaches become more frequent or more severe.   Continue follow up with your cardiologist.   Please plan to return for follow up in 6 months or sooner if needed.

## 2015-06-09 ENCOUNTER — Telehealth: Payer: Self-pay | Admitting: Internal Medicine

## 2015-06-09 NOTE — Telephone Encounter (Signed)
New Message        Pt calling stating that her HR drop really low to 48, stomach problems, and her chest feels really heavy. Please call back and advise.

## 2015-06-09 NOTE — Telephone Encounter (Signed)
She reports fitbit showed HR of 48, and manually palpating her pulse also gave her this number.  States she went to POTS website and found that symptoms she called in about are all related to her POTS. Denies SOB, dizziness/light headedness, syncope.  No other complaints/symptoms at this time. Advised that she was to follow up last month/this month and she stated she could not get through to make this appointment.  I apologized, on behalf of our office, that this was difficult task. Arranged for patient to be seen next Friday at 8:30 a.m. With Dr. Graciela Husbands. Advised to increase fluid intake, take sodium tablets (which she denies taking), monitor HR and notify if drops lower or she has other symptoms that arise. Patient verbalized understanding and agreeable to plan.

## 2015-06-17 ENCOUNTER — Ambulatory Visit (INDEPENDENT_AMBULATORY_CARE_PROVIDER_SITE_OTHER): Payer: 59 | Admitting: Internal Medicine

## 2015-06-17 ENCOUNTER — Encounter: Payer: Self-pay | Admitting: Internal Medicine

## 2015-06-17 VITALS — BP 98/80 | HR 99 | Ht 67.2 in | Wt 144.6 lb

## 2015-06-17 DIAGNOSIS — G90A Postural orthostatic tachycardia syndrome (POTS): Secondary | ICD-10-CM

## 2015-06-17 DIAGNOSIS — I951 Orthostatic hypotension: Secondary | ICD-10-CM

## 2015-06-17 DIAGNOSIS — R000 Tachycardia, unspecified: Secondary | ICD-10-CM | POA: Diagnosis not present

## 2015-06-17 MED ORDER — FLUDROCORTISONE ACETATE 0.1 MG PO TABS: 0.1000 mg | ORAL_TABLET | Freq: Two times a day (BID) | ORAL | Status: DC

## 2015-06-17 NOTE — Patient Instructions (Addendum)
Medication Instructions:  Your physician has recommended you make the following change in your medication:  1) Florinef 0.1 mg twice a day  Labwork: Your physician recommends that you return for lab work in: 3-4 weeks for a BMET  Testing/Procedures: None ordered  Follow-Up: Your physician recommends that you schedule a follow-up appointment in: 12 weeks with Dr. Graciela Husbands.   Any Other Special Instructions Will Be Listed Below (If Applicable). Thank you for choosing [facility]!!      Fludrocortisone tablets What is this medicine? FLUDROCORTISONE (floo droe KOR ti sone) is a corticosteroid. It is used to treat Addison's disease and to treat a salt losing condition called adrenogenital syndrome. This medicine may be used for other purposes; ask your health care provider or pharmacist if you have questions. COMMON BRAND NAME(S): Florinef What should I tell my health care provider before I take this medicine? They need to know if you have any of these conditions: -Cushing's syndrome -diabetes -heart problems or disease -high blood pressure -infection like herpes, measles, tuberculosis, or chickenpox -liver disease -myasthenia gravis -osteoporosis -stomach, ulcer or intestine disease including colitis and diverticulitis -thyroid problem -an unusual or allergic reaction to fludrocortisone, corticosteroids, other medicines, lactose, foods, dyes, or preservatives -pregnant or trying to get pregnant -breast-feeding How should I use this medicine? Take this medicine by mouth with a glass of water. Follow the directions on the prescription label. Take it with food or milk to avoid stomach upset. If you are taking this medicine once a day, take it in the morning. Do not take more medicine than you are told to take. Do not suddenly stop taking your medicine because you may develop a severe reaction. Your doctor will tell you how much medicine to take. If your doctor wants you to  stop the medicine, the dose may be slowly lowered over time to avoid any side effects. Talk to your pediatrician regarding the use of this medicine in children. Special care may be needed. Patients over 43 years old may have a stronger reaction and need a smaller dose. Overdosage: If you think you have taken too much of this medicine contact a poison control center or emergency room at once. NOTE: This medicine is only for you. Do not share this medicine with others. What if I miss a dose? If you miss a dose, take it as soon as you can. If it is almost time for your next dose, take only that dose. Do not take double or extra doses. What may interact with this medicine? Do not take this medicine with any of the following medications: -mifepristone, RU-486 This medicine may also interact with the following medications: -amphotericin B -aspirin and aspirin-like drugs -barbiturates like phenobarbital -digoxin -diuretics -female hormones, like estrogens or progestins and birth control pills -female hormones -medicines for diabetes like insulin -medicines that treat or prevent blood clots like warfarin -phenytoin -rifampin -vaccines This list may not describe all possible interactions. Give your health care provider a list of all the medicines, herbs, non-prescription drugs, or dietary supplements you use. Also tell them if you smoke, drink alcohol, or use illegal drugs. Some items may interact with your medicine. What should I watch for while using this medicine? Visit your doctor or health care professional for regular checks on your progress. If you are taking this medicine over a prolonged period, carry an identification card with your name and address, the type and dose of your medicine, and your doctor's name and address. This medicine  may increase your risk of getting an infection. Stay away from people who are sick. Tell your doctor or health care professional if you are around anyone with  measles or chickenpox. If you are going to have surgery, tell your doctor or health care professional that you have taken this medicine within the last twelve months. Ask your doctor or health care professional about your diet. You may need to lower the amount of salt you eat. The medicine can increase your blood sugar. If you are a diabetic check with your doctor if you need help adjusting the dose of your diabetic medicine. What side effects may I notice from receiving this medicine? Side effects that you should report to your doctor or health care professional as soon as possible: -changes in vision -mental depression, mood swings, mistaken feelings of self importance or of being mistreated -sudden weight gain -swelling of the feet or lower legs -unusually weak or tired Side effects that usually do not require medical attention (report to your doctor or health care professional if they continue or are bothersome): -dizziness -headache -loss of appetite -nausea, vomiting -trouble sleeping This list may not describe all possible side effects. Call your doctor for medical advice about side effects. You may report side effects to FDA at 1-800-FDA-1088. Where should I keep my medicine? Keep out of the reach of children. Store at room temperature between 15 and 30 degrees C (59 and 86 degrees F). Protect from excessive heat. Throw away any unused medicine after the expiration date. NOTE: This sheet is a summary. It may not cover all possible information. If you have questions about this medicine, talk to your doctor, pharmacist, or health care provider.  2015, Elsevier/Gold Standard. (2008-03-08 15:29:43)

## 2015-06-17 NOTE — Progress Notes (Signed)
Patient Care Team: Merri Brunette, MD as PCP - General (Family Medicine) Deetta Perla, MD as Consulting Physician (Pediatrics)   HPI  Stephanie Hudson is a 24 y.o. female Seen in followup for palpitations and exercise intolerance. She  has an elevated blood pressure.  She was being treated with volume repletion and salt with some improvement  She had been much improved.   Over the last 3 weeks however there is marked induration functional status. This is been concurrent with her moving to [place] and starting job. She however is quite adamant that this is not been a stressful move.  She is struggling with fatigue and sleepiness. shes not had signficant      She says she's been doing pretty well with salt and water repletion. She had to decrease her metoprolol because of low blood pressures.   Past Surgical History  Procedure Laterality Date  . Tonsillectomy and adenoidectomy  April 20, 2004  . Tubes      In ears as baby  . Wisdom tooth extraction  May 24, 2011    Current Outpatient Prescriptions  Medication Sig Dispense Refill  . EPINEPHrine (EPIPEN 2-PAK IJ) Inject as directed. Takes prn    . metoprolol succinate (TOPROL-XL) 25 MG 24 hr tablet Take 1 tablet (25 mg total) by mouth daily. (Patient taking differently: Take 12.5 mg by mouth daily. ) 30 tablet 6  . norethindrone-ethinyl estradiol (OVCON-35,BALZIVA,BRIELLYN) 0.4-35 MG-MCG tablet Take 1 tablet by mouth daily.    . ondansetron (ZOFRAN) 4 MG tablet Take 1 tablet (4 mg total) by mouth every 8 (eight) hours as needed for nausea. 20 tablet 5  . OVER THE COUNTER MEDICATION Take 2 tablets by mouth 2 times daily at 12 noon and 4 pm.    . sertraline (ZOLOFT) 25 MG tablet Take 25 mg by mouth daily. Takes 1+1/2 tab;ets daily     No current facility-administered medications for this visit.    Allergies  Allergen Reactions  . Latex Anaphylaxis    Review of Systems negative except from HPI and PMH  Physical Exam BP 98/80  mmHg  Pulse 99  Ht 5' 7.2" (1.707 m)  Wt 144 lb 9.6 oz (65.59 kg)  BMI 22.51 kg/m2 Well developed and well nourished in no acute distress HENT normal E scleral and icterus clear Neck Supple JVP flat; carotids brisk and full Clear to ausculation  Regular rate and rhythm, no murmurs gallops or rub Soft with active bowel sounds No clubbing cyanosis none Edema Alert and oriented, there is some left-sided weakness with finger thumb opposition, and even at elbow flexion Skin warm  ECG normal  Assessment and  Plan  Postural orthostatic tachycardia/dysautonomia   she's been struggling immensely. She has been unable to exercise. We will try adding low-dose Florinef to her current regime to see if any benefit can be accrued by salt retention and alpha agonist activity  She's encouraged to try and get her sleep   We spent more than 50% of our >25 min visit in face to face counseling regarding the above

## 2015-07-08 ENCOUNTER — Other Ambulatory Visit (INDEPENDENT_AMBULATORY_CARE_PROVIDER_SITE_OTHER): Payer: 59

## 2015-07-08 DIAGNOSIS — G901 Familial dysautonomia [Riley-Day]: Secondary | ICD-10-CM

## 2015-07-08 DIAGNOSIS — G909 Disorder of the autonomic nervous system, unspecified: Secondary | ICD-10-CM | POA: Diagnosis not present

## 2015-07-09 LAB — BASIC METABOLIC PANEL
BUN: 9 mg/dL (ref 7–25)
CALCIUM: 8.8 mg/dL (ref 8.6–10.2)
CO2: 24 mmol/L (ref 20–31)
Chloride: 104 mmol/L (ref 98–110)
Creat: 0.58 mg/dL (ref 0.50–1.10)
GLUCOSE: 88 mg/dL (ref 65–99)
POTASSIUM: 3.8 mmol/L (ref 3.5–5.3)
SODIUM: 142 mmol/L (ref 135–146)

## 2015-08-29 ENCOUNTER — Encounter: Payer: Self-pay | Admitting: Family

## 2015-08-29 ENCOUNTER — Ambulatory Visit (INDEPENDENT_AMBULATORY_CARE_PROVIDER_SITE_OTHER): Payer: 59 | Admitting: Family

## 2015-08-29 VITALS — BP 100/70 | HR 96 | Ht 67.0 in | Wt 150.0 lb

## 2015-08-29 DIAGNOSIS — G479 Sleep disorder, unspecified: Secondary | ICD-10-CM

## 2015-08-29 DIAGNOSIS — G901 Familial dysautonomia [Riley-Day]: Secondary | ICD-10-CM

## 2015-08-29 DIAGNOSIS — G909 Disorder of the autonomic nervous system, unspecified: Secondary | ICD-10-CM | POA: Diagnosis not present

## 2015-08-29 DIAGNOSIS — G44219 Episodic tension-type headache, not intractable: Secondary | ICD-10-CM | POA: Diagnosis not present

## 2015-08-29 DIAGNOSIS — G43009 Migraine without aura, not intractable, without status migrainosus: Secondary | ICD-10-CM | POA: Diagnosis not present

## 2015-08-29 DIAGNOSIS — G478 Other sleep disorders: Secondary | ICD-10-CM

## 2015-08-29 MED ORDER — OXYCODONE-ACETAMINOPHEN 5-325 MG PO TABS: ORAL_TABLET | ORAL | Status: DC

## 2015-08-29 NOTE — Patient Instructions (Signed)
I have given you a prescription for the Oxycodone to use if you have a severe migraine.   Continue to take Excedrin Migraine at the onset of headaches to help stop them before they progress.   Continue to work on trying to get more sleep as we discussed today.   Be sure to follow up with cardiology in November as planned.   I will see you back in follow up in 6 months or sooner if needed.

## 2015-08-29 NOTE — Progress Notes (Signed)
Patient: Stephanie Hudson MRN: [id number] Sex: female DOB: 1991/08/10  Provider: [name], NP Location of Care: [facility]  Note type: Routine return visit  History of Present Illness: Referral Source: Dr. Ewing Schlein History from: patient and [facility] chart Chief Complaint: Headaches  Stephanie Hudson is a 24 y.o. young woman with history of tension and migraine headaches. She was last seen February 07, 2015. Stephanie Hudson has taken Amitriptyline and Atenolol in the past for migraine prevention, but is no longer on preventative treatment. When Stephanie Hudson has a particularly severe migraine, she will take a half tablet of Percocet. This usually helps to dull the pain and help her get to sleep. She has some nausea with the migraines and has a prescription for Zofran to use if needed. Stephanie Hudson also has Postural Orthostatic Tachycardia Syndrome (POTS) and is followed by a cardiologist.   Since she was last seen, Stephanie Hudson says that she has been having problems with POTS and that she has not felt well. She is now taking Florinef and salt tablets. She exercises on a prescribed regimen 6 days per week. Kayleena has gained weight since her last visit and says that she feels "puffy" and bloated. She has an upcoming appointment with her cardiologist in early November for follow up and to discuss her weight gain.  Stephanie Hudson has had some increase in headaches due to stress and tension that she believes is related to the POTS flare up. She has a new job at the [employer] and is very happy in that role. She says that she sleeps 8 or 9 hours at night, does not skip meals and drinks a large amount of water because of her history of POTS. The headaches that she has been experiencing are similar to ones that she has had in the past with unilateral left frontal pain. Excedrin Migraine usually helps if she can take it early enough in the headache. Sometimes the headache progresses and she has to take a half tablet  of Percocet to obtain relief. Stephanie Hudson says that she occasionally has posterior neck muscle tension that can sometimes evolve into a headache.   Stephanie Hudson is still engaged to be married in April 2017, and plans to continue to live in [place] afterwards.  Stephanie Hudson has no other health concerns today other than previously mentioned.  Review of Systems: Please see the HPI for neurologic and other pertinent review of systems. Otherwise, the following systems are noncontributory including constitutional, eyes, ears, nose and throat, cardiovascular, respiratory, gastrointestinal, genitourinary, musculoskeletal, skin, endocrine, hematologic/lymph, allergic/immunologic and psychiatric.   Past Medical History  Diagnosis Date  . Palpitations   . Asthma   . Dysmenorrhea   . Fatigue   . Anemia   . Scoliosis   . Chronic headaches   . Dyspnea   . Hx of menorrhagia   . Headache(784.0)   . Low iron   . History of chicken pox   . Concussion May 2002   Hospitalizations: No., Head Injury: No., Nervous System Infections: No., Immunizations up to date: Yes.   Past Medical History Comments: She had a concussion in 03/14/2002 and was hospitalized for 2 nights. She also was hospitalized in 2006 for one night for removal of her tonsils and adenoids. Upper extremity NCV/EMG was done on 07/22/13 for complaints of intermittent tingling and weakness in her hand, and was normal.   Surgical History Past Surgical History  Procedure Laterality Date  . Tonsillectomy and adenoidectomy  April 20, 2004  .  Tubes      In ears as baby  . Wisdom tooth extraction  May 24, 2011    Family History family history includes Cancer - Other in her maternal grandfather; Stroke in her paternal grandfather. Family History is otherwise negative for migraines, seizures, cognitive impairment, blindness, deafness, birth defects, chromosomal disorder, autism.  Allergies Allergies  Allergen Reactions  . Latex Anaphylaxis    Physical  Exam BP 100/70 mmHg  Pulse 96  Ht 5\' 7"  (1.702 m)  Wt 150 lb (68.04 kg)  BMI 23.49 kg/m2  LMP  General: well developed, well nourished female, seated on exam table, in no evident distress  Head: head normocephalic and atraumatic.  Ears, Nose and Throat: Oropharynx benign  Neck: supple with no carotid or supraclavicular bruits.  Cardiovascular: regular rate and rhythm, no murmurs  Musculoskeletal: mild scoliosis   Neurologic Exam  Mental Status: Awake and fully alert. Oriented to place and time. Recent and remote memory intact. Attention span, concentration, and fund of knowledge appropriate. Mood and affect appropriate.  Cranial Nerves: Fundoscopic exam reveals sharp disc margins. Pupils equal, briskly reactive to light. Extraocular movements full without nystagmus. Visual fields full to confrontation. Hearing intact and symmetric to normal voice. Facial sensation intact. Face, tongue, palate move normally and symmetrically. Neck flexion and extension normal.  Motor: Normal bulk and tone. Normal strength in all tested extremity muscles.  Sensory: Intact to touch and temperature in all extremities. She has normal sensation in her fingers of both hands.  Coordination: Rapid alternating movements normal in all extremities. Finger-to-nose and heel-to-shin performed accurately bilaterally. Romberg negative.  Gait and Station: Arises from chair without difficulty. Stance is normal. Gait demonstrates normal stride length and balance. Able to heel, toe, and tandem walk without difficulty.  Reflexes: Diminished and symmetric. Toes downgoing.  Impression 1. Migraine without aura 2. Episodic tension headaches 3. Postural orthostatic tachycardia syndrome  Recommendations for plan of care The patient's previous [facility] were reviewed. Stephanie Hudson has neither had nor required imaging or lab studies since the last visit. She is a 24 year old young woman with history of tension and migraine  headaches and Postural Orthostatic Tachycardia Syndrome (POTS). Stephanie Hudson has had flare up of POTS symptoms and has been treated by a cardiologist. She has had some increase in headaches that she relates to stress of her difficulties with POTS. We talked about stress management today and about follow up for the POTS symptoms. She has an upcoming appointment with her cardiologist in early November. She will return for follow up in 6 months or sooner if needed. I will help her transition to an adult neurology practice at that point. She will continue close follow up with her cardiologist for POTS. Stephanie Hudson agrees with these plans.   The medication list was reviewed and reconciled.  No changes were made in the prescribed medications today.  A complete medication list was provided to the patient.  Dr. Sharene SkeansHickling was consulted regarding the patient.   Total time spent with the patient was 30 minutes, of which 50% or more was spent in counseling and coordination of care.

## 2015-09-19 ENCOUNTER — Encounter: Payer: Self-pay | Admitting: Internal Medicine

## 2015-09-19 ENCOUNTER — Ambulatory Visit (INDEPENDENT_AMBULATORY_CARE_PROVIDER_SITE_OTHER): Payer: 59 | Admitting: Internal Medicine

## 2015-09-19 VITALS — BP 108/80 | HR 66 | Ht 67.0 in | Wt 152.8 lb

## 2015-09-19 DIAGNOSIS — G909 Disorder of the autonomic nervous system, unspecified: Secondary | ICD-10-CM | POA: Diagnosis not present

## 2015-09-19 DIAGNOSIS — G901 Familial dysautonomia [Riley-Day]: Secondary | ICD-10-CM

## 2015-09-19 NOTE — Patient Instructions (Signed)
Medication Instructions: - Hold florinef for now  Labwork: - none   Procedures/Testing: - none  Follow-Up: - Your physician recommends that you schedule a follow-up appointment in: March 2017 with Dr. Graciela HusbandsKlein.  Any Additional Special Instructions Will Be Listed Below (If Applicable).

## 2015-09-19 NOTE — Progress Notes (Signed)
Patient Care Team: Merri Brunette, MD as PCP - General (Family Medicine) Deetta Perla, MD as Consulting Physician (Pediatrics)   HPI  Stephanie Hudson is a 24 y.o. female Seen in followup for palpitations and exercise intolerance. She  has an elevated blood pressure.  She was being treated with volume repletion and salt with some improvement  She had been much improved.   Over the last 3 weeks however there is marked induration functional status. This is been concurrent with her moving to [place] and starting job. She however is quite adamant that this is not been a stressful move.  She is much improved as relates to sleepiness and fatigue. She has had 3 presyncopal/syncopal episodes are associated with prolonged standing and mostly related to her wedding dresses.    We started her on Florinef. It caused her to gain weight. She decreased to once a day.   She says she's been doing pretty well with salt and water repletion.    Past Surgical History  Procedure Laterality Date  . Tonsillectomy and adenoidectomy  April 20, 2004  . Tubes      In ears as baby  . Wisdom tooth extraction  May 24, 2011    Current Outpatient Prescriptions  Medication Sig Dispense Refill  . aspirin-acetaminophen-caffeine (EXCEDRIN MIGRAINE) 250-250-65 MG tablet Take 1 tablets at the onset of headache. May repeat in 6 hours if needed.    Marland Kitchen EPIPEN 2-PAK 0.3 MG/0.3ML SOAJ injection   2  . fludrocortisone (FLORINEF) 0.1 MG tablet Take 1 tablet (0.1 mg total) by mouth 2 (two) times daily. 60 tablet 3  . metoprolol succinate (TOPROL-XL) 25 MG 24 hr tablet Take 1 tablet (25 mg total) by mouth daily. (Patient taking differently: Take 12.5 mg by mouth daily. ) 30 tablet 6  . norethindrone-ethinyl estradiol (OVCON-35,BALZIVA,BRIELLYN) 0.4-35 MG-MCG tablet Take 1 tablet by mouth daily.    . ondansetron (ZOFRAN) 4 MG tablet Take 1 tablet (4 mg total) by mouth every 8 (eight) hours as needed for nausea. 20 tablet 5  .  oxyCODONE-acetaminophen (ROXICET) 5-325 MG tablet Take 1/2 to 1 tablet at onset of migraine 20 tablet 0  . sertraline (ZOLOFT) 25 MG tablet Take 25 mg by mouth daily. Take one and a half tablets daily at bedtime     No current facility-administered medications for this visit.    Allergies  Allergen Reactions  . Latex Anaphylaxis    Review of Systems negative except from HPI and PMH  Physical Exam BP 108/80 mmHg  Pulse 66  Ht  (1.702 m)  Wt 152 lb 12.8 oz (69.31 kg)  BMI 23.93 kg/m2 Well developed and well nourished in no acute distress HENT normal E scleral and icterus clear Neck Supple JVP flat; carotids brisk and full Clear to ausculation  Regular rate and rhythm, no murmurs gallops or rub Soft with active bowel sounds No clubbing cyanosis none Edema Alert and oriented, there is some left-sided weakness with finger thumb opposition, and even at elbow flexion Skin warm     Assessment and  Plan  Postural orthostatic tachycardia/dysautonomia    She is disinclined to continue her Florinef because it has caused her to put on weight. This prompted her to decrease her Florinef from twice a day to once a day. According to hold it for a little while to see how her weight does. I am not sanguine that she will be able to do well without it. In the interim, we will use  external compression with SPANX and abdominal binders    She will continue to exercise.   We spent more than 50% of our >25 min visit in face to face counseling regarding the above

## 2015-11-18 MED FILL — ZENCHENT FE TABLET CHEWABLE: 0.4-35 | 84 days supply | Qty: 112 | Fill #2

## 2015-12-20 DIAGNOSIS — J111 Influenza due to unidentified influenza virus with other respiratory manifestations: Secondary | ICD-10-CM | POA: Diagnosis not present

## 2016-01-20 DIAGNOSIS — R21 Rash and other nonspecific skin eruption: Secondary | ICD-10-CM | POA: Diagnosis not present

## 2016-01-20 DIAGNOSIS — Z91040 Latex allergy status: Secondary | ICD-10-CM | POA: Diagnosis not present

## 2016-01-20 DIAGNOSIS — J3089 Other allergic rhinitis: Secondary | ICD-10-CM | POA: Diagnosis not present

## 2016-01-21 ENCOUNTER — Emergency Department (HOSPITAL_COMMUNITY)
Admission: EM | Admit: 2016-01-21 | Discharge: 2016-01-22 | Disposition: A | Discharge status: Home / Self Care | Payer: 59 | Attending: Emergency Medicine | Admitting: Emergency Medicine

## 2016-01-21 ENCOUNTER — Encounter (HOSPITAL_COMMUNITY): Payer: Self-pay | Admitting: Emergency Medicine

## 2016-01-21 DIAGNOSIS — Z862 Personal history of diseases of the blood and blood-forming organs and certain disorders involving the immune mechanism: Secondary | ICD-10-CM | POA: Insufficient documentation

## 2016-01-21 DIAGNOSIS — Y9289 Other specified places as the place of occurrence of the external cause: Secondary | ICD-10-CM | POA: Diagnosis not present

## 2016-01-21 DIAGNOSIS — Y998 Other external cause status: Secondary | ICD-10-CM | POA: Insufficient documentation

## 2016-01-21 DIAGNOSIS — M419 Scoliosis, unspecified: Secondary | ICD-10-CM | POA: Diagnosis not present

## 2016-01-21 DIAGNOSIS — T7840XA Allergy, unspecified, initial encounter: Secondary | ICD-10-CM | POA: Insufficient documentation

## 2016-01-21 DIAGNOSIS — Z8742 Personal history of other diseases of the female genital tract: Secondary | ICD-10-CM | POA: Diagnosis not present

## 2016-01-21 DIAGNOSIS — Y9389 Activity, other specified: Secondary | ICD-10-CM | POA: Insufficient documentation

## 2016-01-21 DIAGNOSIS — R000 Tachycardia, unspecified: Secondary | ICD-10-CM | POA: Insufficient documentation

## 2016-01-21 DIAGNOSIS — G8929 Other chronic pain: Secondary | ICD-10-CM | POA: Diagnosis not present

## 2016-01-21 DIAGNOSIS — Z79899 Other long term (current) drug therapy: Secondary | ICD-10-CM | POA: Insufficient documentation

## 2016-01-21 DIAGNOSIS — R079 Chest pain, unspecified: Secondary | ICD-10-CM | POA: Diagnosis present

## 2016-01-21 DIAGNOSIS — Z91040 Latex allergy status: Secondary | ICD-10-CM | POA: Diagnosis not present

## 2016-01-21 DIAGNOSIS — X58XXXA Exposure to other specified factors, initial encounter: Secondary | ICD-10-CM | POA: Diagnosis not present

## 2016-01-21 DIAGNOSIS — R0682 Tachypnea, not elsewhere classified: Secondary | ICD-10-CM | POA: Diagnosis not present

## 2016-01-21 DIAGNOSIS — J45901 Unspecified asthma with (acute) exacerbation: Secondary | ICD-10-CM | POA: Diagnosis not present

## 2016-01-21 DIAGNOSIS — Z87828 Personal history of other (healed) physical injury and trauma: Secondary | ICD-10-CM | POA: Insufficient documentation

## 2016-01-21 DIAGNOSIS — Z8619 Personal history of other infectious and parasitic diseases: Secondary | ICD-10-CM | POA: Diagnosis not present

## 2016-01-21 MED ORDER — SODIUM CHLORIDE 0.9 % IV BOLUS (SEPSIS)
1000.0000 mL | Freq: Once | INTRAVENOUS | Status: AC
  Administered 2016-01-21: 1000 mL via INTRAVENOUS

## 2016-01-21 MED ORDER — IPRATROPIUM-ALBUTEROL 0.5-2.5 (3) MG/3ML IN SOLN
3.0000 mL | Freq: Once | RESPIRATORY_TRACT | Status: AC
  Administered 2016-01-21: 3 mL via RESPIRATORY_TRACT

## 2016-01-21 MED ORDER — METHYLPREDNISOLONE SODIUM SUCC 125 MG IJ SOLR
125.0000 mg | Freq: Once | INTRAMUSCULAR | Status: AC
  Administered 2016-01-21: 125 mg via INTRAVENOUS
  Filled 2016-01-21: qty 2

## 2016-01-21 MED ORDER — DIPHENHYDRAMINE HCL 50 MG/ML IJ SOLN
25.0000 mg | Freq: Once | INTRAMUSCULAR | Status: AC
  Administered 2016-01-21: 25 mg via INTRAVENOUS
  Filled 2016-01-21: qty 1

## 2016-01-21 MED ORDER — FAMOTIDINE IN NACL 20-0.9 MG/50ML-% IV SOLN
20.0000 mg | Freq: Once | INTRAVENOUS | Status: AC
  Administered 2016-01-22: 20 mg via INTRAVENOUS
  Filled 2016-01-21: qty 50

## 2016-01-21 MED ORDER — IPRATROPIUM-ALBUTEROL 0.5-2.5 (3) MG/3ML IN SOLN
RESPIRATORY_TRACT | Status: AC
  Filled 2016-01-21: qty 3

## 2016-01-21 NOTE — ED Notes (Signed)
Allergic reaction to latex.  Used epipen pta.  Coughing during triage.

## 2016-01-21 NOTE — ED Provider Notes (Signed)
CSN: [id number]     Arrival date & time 01/21/16  2332 History  By signing my name below, I, Stephanie Hudson, attest that this documentation has been prepared under the direction and in the presence of [name], MD. Electronically Signed: [name], ED Scribe. 01/21/2016. 11:52 PM.    Chief Complaint  Patient presents with  . Allergic Reaction   LEVEL 5 CAVEAT DUE TO RESPIRATORY DISTRESS  The history is provided by a parent and the EMS personnel. No language interpreter was used.   HPI Comments: Stephanie Hudson is a 25 y.o. female who has a hx of blood clots presents to the Emergency Department complaining of a constant, gradual worsening allergic reaction onset this evening. Pt has a latex allergy and states she was near some balloons at a party this evening. She notes having immediate tightness in her chest, throat, and excessive coughing after she left the party. Per father, pt took her epi-pen at 11:11 to her right thigh. She notes this has happened before for her in November and she was not admitted to the hospital. Denies chest pain, abdominal pain, new food and new medications.  Past Medical History  Diagnosis Date  . Palpitations   . Asthma   . Dysmenorrhea   . Fatigue   . Anemia   . Scoliosis   . Chronic headaches   . Dyspnea   . Hx of menorrhagia   . Headache(784.0)   . Low iron   . History of chicken pox   . Concussion May 2002   Past Surgical History  Procedure Laterality Date  . Tonsillectomy and adenoidectomy  April 20, 2004  . Tubes      In ears as baby  . Wisdom tooth extraction  May 24, 2011   Family History  Problem Relation Age of Onset  . Cancer - Other Maternal Grandfather     Died at 54-Brain Tumor  . Stroke Paternal Grandfather     Died at 76   Social History  Substance Use Topics  . Smoking status: Never Smoker   . Smokeless tobacco: Never Used  . Alcohol Use: No   OB History    Gravida Para Term Preterm AB TAB SAB Ectopic Multiple  Living       Review of Systems  A complete 10 system review of systems was obtained and all systems are negative except as noted in the HPI and PMH.    Allergies  Latex  Home Medications   Prior to Admission medications   Medication Sig Start Date End Date Taking? Authorizing Provider  metoprolol succinate (TOPROL-XL) 25 MG 24 hr tablet Take 1 tablet (25 mg total) by mouth daily. Patient taking differently: Take 12.5 mg by mouth daily.  12/31/14  Yes [name], MD  sertraline (ZOLOFT) 25 MG tablet Take 25 mg by mouth daily. Take one and a half tablets daily at bedtime   Yes Historical Provider, MD  ZENCHENT FE 0.4-35 MG-MCG tablet Chew 1 tablet by mouth daily. 11/18/15  Yes Historical Provider, MD  diphenhydrAMINE (BENADRYL) 25 MG tablet Take 1 tablet (25 mg total) by mouth every 6 (six) hours. 01/22/16   [name], MD  EPINEPHrine 0.3 mg/0.3 mL IJ SOAJ injection Inject 0.3 mLs (0.3 mg total) into the muscle once. For severe allergic reaction 01/22/16   [name], MD  famotidine (PEPCID) 20 MG tablet Take 1 tablet (20 mg total) by mouth 2 (  two) times daily. 01/22/16   [name], MD  fludrocortisone (FLORINEF) 0.1 MG tablet Take 1 tablet (0.1 mg total) by mouth 2 (two) times daily. Patient not taking: Reported on 01/22/2016 06/17/15   [name], MD  ondansetron (ZOFRAN) 4 MG tablet Take 1 tablet (4 mg total) by mouth every 8 (eight) hours as needed for nausea. Patient not taking: Reported on 01/22/2016 08/31/13   [name], NP  oxyCODONE-acetaminophen (ROXICET) 5-325 MG tablet Take 1/2 to 1 tablet at onset of migraine Patient not taking: Reported on 01/22/2016 08/29/15   [name], NP  predniSONE (DELTASONE) 50 MG tablet 1 tablet PO daily 01/22/16   [name], MD   Triage vitals: BP 138/81 mmHg  Pulse 109  Resp 27  SpO2 100%  LMP 01/20/2013 Physical Exam  Constitutional: She is oriented to person, place, and time. She appears  well-developed and well-nourished. No distress.  Speaking in short phrases  HENT:  Head: Normocephalic and atraumatic.  Mouth/Throat: Oropharynx is clear and moist. No oropharyngeal exudate.  No posterior oropharynx swelling, no tongue or throat swelling.  Eyes: Conjunctivae and EOM are normal. Pupils are equal, round, and reactive to light.  Neck: Normal range of motion. Neck supple.  No meningismus.  Cardiovascular: Regular rhythm, normal heart sounds and intact distal pulses.  Tachycardia present.   No murmur heard. Pulmonary/Chest: Breath sounds normal. Tachypnea noted. She is in respiratory distress.  Inspiratory and expiratory wheezing  Abdominal: Soft. There is no tenderness. There is no rebound and no guarding.  Musculoskeletal: Normal range of motion. She exhibits no edema or tenderness.  Neurological: She is alert and oriented to person, place, and time. No cranial nerve deficit. She exhibits normal muscle tone. Coordination normal.  No ataxia on finger to nose bilaterally. No pronator drift. 5/5 strength throughout. CN 2-12 intact.Equal grip strength. Sensation intact.   Skin: Skin is warm. No rash noted.  Psychiatric: She has a normal mood and affect. Her behavior is normal.  Nursing note and vitals reviewed.   ED Course  Procedures  DIAGNOSTIC STUDIES: Oxygen Saturation is 100% on RA, normal by my interpretation.  COORDINATION OF CARE:  11:48 PM Discussed treatment plan which includes duoneb, solumedrol, pepcid, and bendaryl with pt at bedside and pt agreed to plan. Pt was informed she will be watched very closely for the next few hours.  Labs Review Labs Reviewed - No data to display  Imaging Review No results found. I have personally reviewed and evaluated these images and lab results as part of my medical decision-making.   EKG Interpretation None      MDM   Final diagnoses:  Allergic reaction, initial encounter   Patient suspects allergic reaction to  latex. Denies any other exposures. Endorses chest tightness, throat tightness and wheezing after being around some balloons. She gave herself an epinephrine pen at 2311.  No swelling of oropharynx. Wheezing throughout.  Patient given nebulizer, steroids, antihistamines and IV fluids. Will monitor.  Recheck at 2:15 AM which is actually 3:15 AM due to time change. She is much improved. Feels back to baseline. She is anxious to go home. Lungs are clear. No tongue , lip, or throat swelling. She is tolerated by mouth and ambulatory.  Will discharge with course of prednisone and antihistamines. Refill epipen. Return precautions discussed.   CRITICAL CARE Performed by: [name] Total critical care time: 30 minutes Critical care time was exclusive of separately billable procedures and treating other patients. Critical care was necessary  to treat or prevent imminent or life-threatening deterioration. Critical care was time spent personally by me on the following activities: development of treatment plan with patient and/or surrogate as well as nursing, discussions with consultants, evaluation of patient's response to treatment, examination of patient, obtaining history from patient or surrogate, ordering and performing treatments and interventions, ordering and review of laboratory studies, ordering and review of radiographic studies, pulse oximetry and re-evaluation of patient's condition.    I personally performed the services described in this documentation, which was scribed in my presence. The recorded information has been reviewed and is accurate.   Glynn OctaveStephen Bobby Barton, MD 01/22/16 [phone]

## 2016-01-22 DIAGNOSIS — T7840XA Allergy, unspecified, initial encounter: Secondary | ICD-10-CM | POA: Diagnosis not present

## 2016-01-22 DIAGNOSIS — R000 Tachycardia, unspecified: Secondary | ICD-10-CM | POA: Diagnosis not present

## 2016-01-22 DIAGNOSIS — Z8619 Personal history of other infectious and parasitic diseases: Secondary | ICD-10-CM | POA: Diagnosis not present

## 2016-01-22 DIAGNOSIS — R0682 Tachypnea, not elsewhere classified: Secondary | ICD-10-CM | POA: Diagnosis not present

## 2016-01-22 DIAGNOSIS — J45901 Unspecified asthma with (acute) exacerbation: Secondary | ICD-10-CM | POA: Diagnosis not present

## 2016-01-22 DIAGNOSIS — Z8742 Personal history of other diseases of the female genital tract: Secondary | ICD-10-CM | POA: Diagnosis not present

## 2016-01-22 DIAGNOSIS — M419 Scoliosis, unspecified: Secondary | ICD-10-CM | POA: Diagnosis not present

## 2016-01-22 DIAGNOSIS — Z862 Personal history of diseases of the blood and blood-forming organs and certain disorders involving the immune mechanism: Secondary | ICD-10-CM | POA: Diagnosis not present

## 2016-01-22 DIAGNOSIS — G8929 Other chronic pain: Secondary | ICD-10-CM | POA: Diagnosis not present

## 2016-01-22 MED ORDER — DIPHENHYDRAMINE HCL 25 MG PO TABS: 25.0000 mg | ORAL_TABLET | Freq: Four times a day (QID) | ORAL | Status: DC

## 2016-01-22 MED ORDER — FAMOTIDINE 20 MG PO TABS: 20.0000 mg | ORAL_TABLET | Freq: Two times a day (BID) | ORAL | Status: DC

## 2016-01-22 MED ORDER — EPINEPHRINE 0.3 MG/0.3ML IJ SOAJ: 0.3000 mg | Freq: Once | INTRAMUSCULAR | Status: AC

## 2016-01-22 MED ORDER — PREDNISONE 50 MG PO TABS: ORAL_TABLET | ORAL | Status: DC

## 2016-01-22 NOTE — Discharge Instructions (Signed)
Anaphylactic Reaction Use the medications as prescribed. Follow up with your doctor. Return to the ED if you develop new or worsening symptoms. An anaphylactic reaction is a sudden, severe allergic reaction that involves the whole body. It can be life threatening. A hospital stay is often required. People with asthma, eczema, or hay fever are slightly more likely to have an anaphylactic reaction. CAUSES  An anaphylactic reaction may be caused by anything to which you are allergic. After being exposed to the allergic substance, your immune system becomes sensitized to it. When you are exposed to that allergic substance again, an allergic reaction can occur. Common causes of an anaphylactic reaction include:  Medicines.  Foods, especially peanuts, wheat, shellfish, milk, and eggs.  Insect bites or stings.  Blood products.  Chemicals, such as dyes, latex, and contrast material used for imaging tests. SYMPTOMS  When an allergic reaction occurs, the body releases histamine and other substances. These substances cause symptoms such as tightening of the airway. Symptoms often develop within seconds or minutes of exposure. Symptoms may include:  Skin rash or hives.  Itching.  Chest tightness.  Swelling of the eyes, tongue, or lips.  Trouble breathing or swallowing.  Lightheadedness or fainting.  Anxiety or confusion.  Stomach pains, vomiting, or diarrhea.  Nasal congestion.  A fast or irregular heartbeat (palpitations). DIAGNOSIS  Diagnosis is based on your history of recent exposure to allergic substances, your symptoms, and a physical exam. Your caregiver may also perform blood or urine tests to confirm the diagnosis. TREATMENT  Epinephrine medicine is the main treatment for an anaphylactic reaction. Other medicines that may be used for treatment include antihistamines, steroids, and albuterol. In severe cases, fluids and medicine to support blood pressure may be given through an  intravenous line (IV). Even if you improve after treatment, you need to be observed to make sure your condition does not get worse. This may require a stay in the hospital. [place] a medical alert bracelet or necklace stating your allergy.  You and your family must learn how to use an anaphylaxis kit or give an epinephrine injection to temporarily treat an emergency allergic reaction. Always carry your epinephrine injection or anaphylaxis kit with you. This can be lifesaving if you have a severe reaction.  Do not drive or perform tasks after treatment until the medicines used to treat your reaction have worn off, or until your caregiver says it is okay.  If you have hives or a rash:  Take medicines as directed by your caregiver.  You may use an over-the-counter antihistamine (diphenhydramine) as needed.  Apply cold compresses to the skin or take baths in cool water. Avoid hot baths or showers. SEEK MEDICAL CARE IF:   You develop symptoms of an allergic reaction to a new substance. Symptoms may start right away or minutes later.  You develop a rash, hives, or itching.  You develop new symptoms. SEEK IMMEDIATE MEDICAL CARE IF:   You have swelling of the mouth, difficulty breathing, or wheezing.  You have a tight feeling in your chest or throat.  You develop hives, swelling, or itching all over your body.  You develop severe vomiting or diarrhea.  You feel faint or pass out. This is an emergency. Use your epinephrine injection or anaphylaxis kit as you have been instructed. Call your local emergency services (911 in [place] Even if you improve after the injection, you need to be examined at a hospital emergency department. MAKE SURE  YOU:   Understand these instructions.  Will watch your condition.  Will get help right away if you are not doing well or get worse.   This information is not intended to replace advice given to you by your health care provider.  Make sure you discuss any questions you have with your health care provider.   Document Released: 10/29/2005 Document Revised: 11/03/2013 Document Reviewed: 05/11/2015 Elsevier Interactive Patient Education [employer].

## 2016-01-23 MED FILL — FAMOTIDINE 20 MG TABLET: 20 | 15 days supply | Qty: 30 | Fill #0

## 2016-01-23 MED FILL — EPINEPHRINE 0.3 MG AUTO-INJ: 0.3 | 30 days supply | Qty: 2 | Fill #0

## 2016-01-23 MED FILL — predniSONE 50 MG TABS: 50 | 5 days supply | Qty: 5 | Fill #0

## 2016-01-27 ENCOUNTER — Encounter: Payer: Self-pay | Admitting: Internal Medicine

## 2016-01-27 ENCOUNTER — Ambulatory Visit (INDEPENDENT_AMBULATORY_CARE_PROVIDER_SITE_OTHER): Payer: 59 | Admitting: Internal Medicine

## 2016-01-27 VITALS — BP 112/78 | Ht 67.0 in | Wt 150.4 lb

## 2016-01-27 DIAGNOSIS — G909 Disorder of the autonomic nervous system, unspecified: Secondary | ICD-10-CM

## 2016-01-27 DIAGNOSIS — G90A Postural orthostatic tachycardia syndrome (POTS): Secondary | ICD-10-CM

## 2016-01-27 DIAGNOSIS — G901 Familial dysautonomia [Riley-Day]: Secondary | ICD-10-CM

## 2016-01-27 DIAGNOSIS — I951 Orthostatic hypotension: Secondary | ICD-10-CM | POA: Diagnosis not present

## 2016-01-27 DIAGNOSIS — R000 Tachycardia, unspecified: Secondary | ICD-10-CM | POA: Diagnosis not present

## 2016-01-27 NOTE — Patient Instructions (Signed)
Medication Instructions:  Your physician recommends that you continue on your current medications as directed. Please refer to the Current Medication list given to you today.  Labwork: None ordered  Testing/Procedures: None ordered  Follow-Up: Your physician wants you to follow-up in: 6 months with Dr. Klein.  You will receive a reminder letter in the mail two months in advance. If you don't receive a letter, please call our office to schedule the follow-up appointment.   If you need a refill on your cardiac medications before your next appointment, please call your pharmacy.  Thank you for choosing [facility]!!     

## 2016-01-27 NOTE — Progress Notes (Signed)
Patient Care Team: Merri Brunetteandace Smith, MD as PCP - General (Family Medicine) Deetta PerlaWilliam H Hickling, MD as Consulting Physician (Pediatrics)   HPI  Rolley SimsOlivia R Hudson is a 25 y.o. female Seen in followup for palpitations and exercise intolerance. She  has an elevated blood pressure.  She was being treated with volume repletion and salt with some improvement  She had been much improved.   Over the last 3 weeks however there is marked induration functional status. This is been concurrent with her moving to [place] and starting job. She however is quite adamant that this is not been a stressful move.  She is much improved as relates to sleepiness and fatigue. She has had 3 presyncopal/syncopal episodes are associated with prolonged standing and mostly related to her wedding dresses.    We started her on Florine she is no longer taking it  She is to wear a different dress for the reception  Some dizziness this last week since having been started on prednisone for reaction to latex   She says she's been doing pretty well with salt and water repletion.    Past Surgical History  Procedure Laterality Date  . Tonsillectomy and adenoidectomy  April 20, 2004  . Tubes      In ears as baby  . Wisdom tooth extraction  May 24, 2011    Current Outpatient Prescriptions  Medication Sig Dispense Refill  . diphenhydrAMINE (BENADRYL) 25 MG tablet Take 1 tablet (25 mg total) by mouth every 6 (six) hours. 20 tablet 0  . EPINEPHrine 0.3 mg/0.3 mL IJ SOAJ injection Inject 0.3 mLs (0.3 mg total) into the muscle once. For severe allergic reaction 1 Device 0  . famotidine (PEPCID) 20 MG tablet Take 1 tablet (20 mg total) by mouth 2 (two) times daily. 30 tablet 0  . metoprolol succinate (TOPROL-XL) 25 MG 24 hr tablet Take 1 tablet (25 mg total) by mouth daily. 30 tablet 6  . ondansetron (ZOFRAN) 4 MG tablet Take 1 tablet (4 mg total) by mouth every 8 (eight) hours as needed for nausea. 20 tablet 5  .  oxyCODONE-acetaminophen (ROXICET) 5-325 MG tablet Take 1/2 to 1 tablet at onset of migraine 20 tablet 0  . predniSONE (DELTASONE) 50 MG tablet 1 tablet PO daily 5 tablet 0  . sertraline (ZOLOFT) 25 MG tablet Take 25 mg by mouth daily. Take one and a half tablets daily at bedtime    . ZENCHENT FE 0.4-35 MG-MCG tablet Chew 1 tablet by mouth daily.  3   No current facility-administered medications for this visit.    Allergies  Allergen Reactions  . Latex Anaphylaxis    Review of Systems negative except from HPI and PMH  Physical Exam BP 112/78 mmHg  Ht 5\' 7"  (1.702 m)  Wt 150 lb 6.4 oz (68.221 kg)  BMI 23.55 kg/m2  LMP 01/20/2013 Well developed and well nourished in no acute distress HENT normal E scleral and icterus clear Neck Supple JVP flat; carotids brisk and full Clear to ausculation  Regular rate and rhythm, no murmurs gallops or rub Soft with active bowel sounds No clubbing cyanosis none Edema Alert and oriented, there is some left-sided weakness with finger thumb opposition, and even at elbow flexion Skin warm     Assessment and  Plan  Postural orthostatic tachycardia/dysautonomia  She is doing pretty well   Have reviewed strategies for the wedding   We spent more than 50% of our >25 min visit in face to face counseling regarding  the above

## 2016-02-17 ENCOUNTER — Encounter: Payer: Self-pay | Admitting: Family

## 2016-02-17 MED FILL — ZENCHENT FE TABLET CHEWABLE: 0.4-35 | 84 days supply | Qty: 112 | Fill #3

## 2016-05-02 ENCOUNTER — Telehealth: Payer: Self-pay | Admitting: Internal Medicine

## 2016-05-02 NOTE — Telephone Encounter (Signed)
I called and spoke with the patient. She reports that last summer she had trouble with her heart rate going fast/ slow within a 15 minute period. She reports that yesterday she left work, went to the store, came home to put up groceries and passed out.  She states she hit her head slightly. 2 hours later, she was cooking dinner and felt pre-syncopal. HR was around 60 bpm at the time. She is having some GYN issues- symptoms have been worse with periods, but she is now on the 365 pill and has been doing well until the last month. She is now having frequent periods (almost every other day) over the last month. She is mostly sitting at work, is drinking large amounts of water, and adding salt to food. She is not using salt tablets. Her showers are divided almost evenly between morning and night. She has not been outside in the heat very much. She has not taken florinef in some time due to weight gain. I advised her I will review with Dr. Graciela HusbandsKlein and call her back. She is agreeable.

## 2016-05-02 NOTE — Telephone Encounter (Signed)
I am  concerned regarding maintaining. I would have her follow-up with her gynecologist.   For now, would continue with the salt and fluids. Congratulations on being married. It might be worth low-dose Florinef for a little while until things straighten out. 0.1 mg daily. If she is willing   We can have her come in next week or 2 if need be

## 2016-05-02 NOTE — Telephone Encounter (Signed)
I left a message for the patient of Dr. Odessa FlemingKlein's recommendations on her identified voice mail. I asked that she call back to confirm if she wants to start florinef again. [facility] arrange follow up for her in the next 1-2 weeks.

## 2016-05-02 NOTE — Telephone Encounter (Signed)
Pt calling regarding almost passing out yesterday twice-heart fluctuating -some nausea but hit her head so not sure if it was from that-wants to know if she needs to adjust med or come in?  [phone]

## 2016-05-04 MED FILL — ZENCHENT FE TABLET CHEWABLE: 0.4-35 | 84 days supply | Qty: 112 | Fill #0

## 2016-05-04 NOTE — Telephone Encounter (Signed)
Patient scheduled for 7/6 with Dr. Graciela HusbandsKlein.

## 2016-05-09 DIAGNOSIS — N921 Excessive and frequent menstruation with irregular cycle: Secondary | ICD-10-CM | POA: Diagnosis not present

## 2016-05-09 DIAGNOSIS — N760 Acute vaginitis: Secondary | ICD-10-CM | POA: Diagnosis not present

## 2016-05-17 ENCOUNTER — Ambulatory Visit (INDEPENDENT_AMBULATORY_CARE_PROVIDER_SITE_OTHER): Payer: 59 | Admitting: Internal Medicine

## 2016-05-17 ENCOUNTER — Encounter (INDEPENDENT_AMBULATORY_CARE_PROVIDER_SITE_OTHER): Payer: Self-pay

## 2016-05-17 ENCOUNTER — Encounter: Payer: Self-pay | Admitting: Internal Medicine

## 2016-05-17 VITALS — BP 111/75 | HR 79 | Ht 67.0 in | Wt 153.2 lb

## 2016-05-17 DIAGNOSIS — G901 Familial dysautonomia [Riley-Day]: Secondary | ICD-10-CM

## 2016-05-17 DIAGNOSIS — G909 Disorder of the autonomic nervous system, unspecified: Secondary | ICD-10-CM | POA: Diagnosis not present

## 2016-05-17 MED ORDER — MIDODRINE HCL 2.5 MG PO TABS: 2.5000 mg | ORAL_TABLET | Freq: Three times a day (TID) | ORAL | Status: DC

## 2016-05-17 MED FILL — MIDODRINE HCL 2.5 MG TABLET: 2.5 | 90 days supply | Qty: 270 | Fill #0

## 2016-05-17 MED FILL — PHILITH 0.4-0.035 MG TABLET: 0.4-35 | 84 days supply | Qty: 112 | Fill #0

## 2016-05-17 NOTE — Progress Notes (Signed)
Patient Care Team: Merri Brunetteandace Smith, MD as PCP - General (Family Medicine) Deetta PerlaWilliam H Hickling, MD as Consulting Physician (Pediatrics)   HPI  Stephanie Hudson is a 25 y.o. female Seen in followup for palpitations and exercise intolerance. She  has an elevated blood pressure.  She was being treated with volume repletion and salt with some improvement  She had been much improved.   Over the last 3 weeks however there is marked induration functional status. This is been concurrent with her moving to [place] and starting job. She however is quite adamant that this is not been a stressful move.  She is much improved as relates to sleepiness and fatigue. She had had 3 presyncopal/syncopal episodes  associated with prolonged standing and mostly related to her wedding dresses.   She had a syncopal episode with insufficient warning to protect her.  She started bleeding again 2 months ago. She has seen her gynecologist is on a new regime.  She says she's been doing pretty well with salt and water repletion.    Past Surgical History  Procedure Laterality Date  . Tonsillectomy and adenoidectomy  April 20, 2004  . Tubes      In ears as baby  . Wisdom tooth extraction  May 24, 2011    Current Outpatient Prescriptions  Medication Sig Dispense Refill  . diphenhydrAMINE (BENADRYL) 25 MG tablet Take 1 tablet (25 mg total) by mouth every 6 (six) hours. 20 tablet 0  . EPINEPHrine 0.3 mg/0.3 mL IJ SOAJ injection Inject 0.3 mLs (0.3 mg total) into the muscle once. For severe allergic reaction 1 Device 0  . metoprolol succinate (TOPROL-XL) 25 MG 24 hr tablet Take 1 tablet (25 mg total) by mouth daily. 30 tablet 6  . ondansetron (ZOFRAN) 4 MG tablet Take 1 tablet (4 mg total) by mouth every 8 (eight) hours as needed for nausea. 20 tablet 5  . oxyCODONE-acetaminophen (ROXICET) 5-325 MG tablet Take 1/2 to 1 tablet at onset of migraine 20 tablet 0  . sertraline (ZOLOFT) 25 MG tablet Take 25 mg by mouth daily. Take  one and a half tablets daily at bedtime    . PHILITH 0.4-35 MG-MCG tablet Take 1 tablet by mouth daily.  12   No current facility-administered medications for this visit.    Allergies  Allergen Reactions  . Latex Anaphylaxis    Review of Systems negative except from HPI and PMH  Physical Exam BP 111/75 mmHg  Pulse 79  Ht 5\' 7"  (1.702 m)  Wt 153 lb 3.2 oz (69.491 kg)  BMI 23.99 kg/m2  SpO2 99% Well developed and well nourished in no acute distress HENT normal E scleral and icterus clear Neck Supple JVP flat; carotids brisk and full Clear to ausculation  Regular rate and rhythm, no murmurs gallops or rub Soft with active bowel sounds No clubbing cyanosis none Edema Alert and oriented, there is some left-sided weakness with finger thumb opposition, and even at elbow flexion Skin warm     Assessment and  Plan  Postural orthostatic tachycardia/dysautonomia \ Syncope    The patient had more problems with the heat. She also started bleeding again. She is followed up with Dr. Llana AlimentGrawal. She will continue to work on salt and water repletion. We'll add back ProAmatine 2.5 mg 3 times a day.  She will let us know next week if there is any improvement

## 2016-05-17 NOTE — Patient Instructions (Signed)
Medication Instructions: - Your physician has recommended you make the following change in your medication:  1) Start proamitine (midodrine) 2.5 mg one tablet by mouth three times daily  Labwork: - none  Procedures/Testing: -none  Follow-Up: - Your physician recommends that you schedule a follow-up appointment in: 6-8 weeks with Dr. Graciela HusbandsKlein.  Any Additional Special Instructions Will Be Listed Below (If Applicable). - Call Samhitha Rosen, Dr. Odessa FlemingKlein's nurse, in 2 weeks to let us know how you are doing on the new medicine    If you need a refill on your cardiac medications before your next appointment, please call your pharmacy.

## 2016-06-19 ENCOUNTER — Encounter: Payer: Self-pay | Admitting: Internal Medicine

## 2016-06-25 ENCOUNTER — Telehealth: Payer: Self-pay | Admitting: Internal Medicine

## 2016-06-25 NOTE — Telephone Encounter (Signed)
Message  Received: Today  Message Contents  Duke SalviaSteven C Klein, MD  Jacqlyn KraussAnne M Lankford, RN        aNNE TELL HER I am so GLAD that she is feeling better  The mitodrine works a little like adrenaline so a little HR is expected

## 2016-06-25 NOTE — Telephone Encounter (Signed)
New message     The medicine that the doctor put pt on seems to be controlling BP but pts heart rate is higher. Please advise.

## 2016-06-25 NOTE — Telephone Encounter (Signed)
Discussed Dr Odessa FlemingKlein's comments with patient.

## 2016-06-25 NOTE — Telephone Encounter (Signed)
I spoke with the pt and she called the office to give an update on her symptoms since she started midodrine.  The pt has noticed improvement in her blood pressure and heart rate and states that "things have settled down" and are not fluctuating like before.  The pt feels well on midodrine but has noticed that her average heart rate on fit bit has increased to 80-90 range since starting midodrine.  Normally her heart rate runs 70-80. I advised the pt to continue current midodrine dosage and keep planned follow-up appointment with Dr Graciela HusbandsKlein.  I will forward this message to Dr Graciela HusbandsKlein and Herbert SetaHeather to review and follow-up with pt if needed.

## 2016-06-25 NOTE — Telephone Encounter (Signed)
Call disconnected while I was speaking with patient, I tried to call pt again, got voicemail, left message for pt to call office.

## 2016-06-25 NOTE — Telephone Encounter (Signed)
F/u ° ° ° ° ° °Pt returning nurse call.  °

## 2016-07-03 ENCOUNTER — Encounter: Payer: Self-pay | Admitting: Internal Medicine

## 2016-07-03 ENCOUNTER — Ambulatory Visit (INDEPENDENT_AMBULATORY_CARE_PROVIDER_SITE_OTHER): Payer: 59 | Admitting: Internal Medicine

## 2016-07-03 ENCOUNTER — Encounter (INDEPENDENT_AMBULATORY_CARE_PROVIDER_SITE_OTHER): Payer: Self-pay

## 2016-07-03 VITALS — BP 118/70 | HR 86 | Ht 67.0 in | Wt 157.4 lb

## 2016-07-03 DIAGNOSIS — Z6824 Body mass index (BMI) 24.0-24.9, adult: Secondary | ICD-10-CM | POA: Diagnosis not present

## 2016-07-03 DIAGNOSIS — Z01419 Encounter for gynecological examination (general) (routine) without abnormal findings: Secondary | ICD-10-CM | POA: Diagnosis not present

## 2016-07-03 DIAGNOSIS — R000 Tachycardia, unspecified: Secondary | ICD-10-CM

## 2016-07-03 DIAGNOSIS — G90A Postural orthostatic tachycardia syndrome (POTS): Secondary | ICD-10-CM

## 2016-07-03 DIAGNOSIS — I951 Orthostatic hypotension: Secondary | ICD-10-CM | POA: Diagnosis not present

## 2016-07-03 NOTE — Patient Instructions (Signed)
Medication Instructions: - Your physician recommends that you continue on your current medications as directed. Please refer to the Current Medication list given to you today.  Labwork: - none  Procedures/Testing: - none  Follow-Up: - Your physician wants you to follow-up in: 6 months with Dr. Klein. You will receive a reminder letter in the mail two months in advance. If you don't receive a letter, please call our office to schedule the follow-up appointment.  Any Additional Special Instructions Will Be Listed Below (If Applicable).     If you need a refill on your cardiac medications before your next appointment, please call your pharmacy.   

## 2016-07-03 NOTE — Progress Notes (Signed)
Patient Care Team: Merri Brunetteandace Smith, MD as PCP - General (Family Medicine) Deetta PerlaWilliam H Hickling, MD as Consulting Physician (Pediatrics)   HPI  Stephanie Hudson is a 25 y.o. female Seen in followup for palpitations and exercise intolerance. She  has an elevated blood pressure.  She was being treated with volume repletion and salt with some improvement  She had been much improved.    She had a syncopal episode with insufficient warning to protect her. Also more symptoms related to heat earlier this summer. We started her on ProAmatine; she called and said that she was significantly improved.  S.he remians better on the pramatine  Her menstrual bleeding is improved; she is following up with Dr Llana AlimentGrawal     Past Surgical History:  Procedure Laterality Date  . TONSILLECTOMY AND ADENOIDECTOMY  April 20, 2004  . tubes     In ears as baby  . WISDOM TOOTH EXTRACTION  May 24, 2011    Current Outpatient Prescriptions  Medication Sig Dispense Refill  . diphenhydrAMINE (BENADRYL) 25 MG tablet Take 25 mg by mouth every 6 (six) hours as needed (allergies).    . EPINEPHrine 0.3 mg/0.3 mL IJ SOAJ injection Inject 0.3 mLs (0.3 mg total) into the muscle once. For severe allergic reaction 1 Device 0  . midodrine (PROAMATINE) 2.5 MG tablet Take 1 tablet (2.5 mg total) by mouth 3 (three) times daily with meals. 270 tablet 3  . ondansetron (ZOFRAN) 4 MG tablet Take 1 tablet (4 mg total) by mouth every 8 (eight) hours as needed for nausea. 20 tablet 5  . oxyCODONE-acetaminophen (ROXICET) 5-325 MG tablet Take 1/2 to 1 tablet at onset of migraine 20 tablet 0  . PHILITH 0.4-35 MG-MCG tablet Take 1 tablet by mouth daily.  12  . sertraline (ZOLOFT) 25 MG tablet Take one and a half tablets by mouth at bedtime     No current facility-administered medications for this visit.   AE  Allergies  Allergen Reactions  . Latex Anaphylaxis    Review of Systems negative except from HPI and PMH  Physical Exam BP 118/70    Pulse 86   Ht 5\' 7"  (1.702 m)   Wt 157 lb 6.4 oz (71.4 kg)   SpO2 97%   BMI 24.65 kg/m  Well developed and well nourished in no acute distress HENT normal E scleral and icterus clear Neck Supple JVP flat; carotids brisk and full Clear to ausculation  Regular rate and rhythm, no murmurs gallops or rub Soft with active bowel sounds No clubbing cyanosis none Edema Alert and oriented,  Skin warm     Assessment and  Plan  Postural orthostatic tachycardia/dysautonomia \ Syncope    The patient had more problems with the heat. She also started bleeding again. She is followed up with Dr. Llana AlimentGrawal. She will continue to work on salt and water repletion. We'll add back ProAmatine 2.5 mg 3 times a day.   shes's much improved    Can use extra proamatine at evening if necessary

## 2016-07-27 DIAGNOSIS — R9431 Abnormal electrocardiogram [ECG] [EKG]: Secondary | ICD-10-CM | POA: Diagnosis not present

## 2016-07-27 DIAGNOSIS — Q382 Macroglossia: Secondary | ICD-10-CM | POA: Diagnosis not present

## 2016-07-27 DIAGNOSIS — R061 Stridor: Secondary | ICD-10-CM | POA: Diagnosis not present

## 2016-07-27 DIAGNOSIS — T7840XA Allergy, unspecified, initial encounter: Secondary | ICD-10-CM | POA: Diagnosis not present

## 2016-07-27 DIAGNOSIS — I498 Other specified cardiac arrhythmias: Secondary | ICD-10-CM | POA: Diagnosis not present

## 2016-07-28 DIAGNOSIS — T7840XA Allergy, unspecified, initial encounter: Secondary | ICD-10-CM | POA: Diagnosis not present

## 2016-08-13 MED FILL — PHILITH 0.4-0.035 MG TABLET: 0.4-35 | 84 days supply | Qty: 112 | Fill #1

## 2016-08-17 DIAGNOSIS — J0110 Acute frontal sinusitis, unspecified: Secondary | ICD-10-CM | POA: Diagnosis not present

## 2016-10-24 DIAGNOSIS — N921 Excessive and frequent menstruation with irregular cycle: Secondary | ICD-10-CM | POA: Diagnosis not present

## 2016-10-24 DIAGNOSIS — E282 Polycystic ovarian syndrome: Secondary | ICD-10-CM | POA: Diagnosis not present

## 2016-10-26 MED FILL — BRIELLYN TABLET: 0.4-35 | 84 days supply | Qty: 84 | Fill #0

## 2016-12-08 DIAGNOSIS — R000 Tachycardia, unspecified: Secondary | ICD-10-CM | POA: Diagnosis not present

## 2016-12-08 DIAGNOSIS — R9431 Abnormal electrocardiogram [ECG] [EKG]: Secondary | ICD-10-CM | POA: Diagnosis not present

## 2016-12-08 DIAGNOSIS — R05 Cough: Secondary | ICD-10-CM | POA: Diagnosis not present

## 2016-12-08 DIAGNOSIS — T7840XA Allergy, unspecified, initial encounter: Secondary | ICD-10-CM | POA: Diagnosis not present

## 2017-01-16 MED FILL — BRIELLYN TABLET: 0.4-35 | 63 days supply | Qty: 84 | Fill #1

## 2017-01-18 DIAGNOSIS — J3089 Other allergic rhinitis: Secondary | ICD-10-CM | POA: Diagnosis not present

## 2017-01-18 DIAGNOSIS — Z91040 Latex allergy status: Secondary | ICD-10-CM | POA: Diagnosis not present

## 2017-01-18 DIAGNOSIS — R21 Rash and other nonspecific skin eruption: Secondary | ICD-10-CM | POA: Diagnosis not present

## 2017-01-28 DIAGNOSIS — F419 Anxiety disorder, unspecified: Secondary | ICD-10-CM | POA: Diagnosis not present

## 2017-01-28 DIAGNOSIS — R634 Abnormal weight loss: Secondary | ICD-10-CM | POA: Diagnosis not present

## 2017-01-28 DIAGNOSIS — R197 Diarrhea, unspecified: Secondary | ICD-10-CM | POA: Diagnosis not present

## 2017-02-08 MED FILL — SERTRALINE HCL 50 MG TABLET: 50 | 90 days supply | Qty: 135 | Fill #0

## 2017-03-25 MED FILL — MIDODRINE HCL 2.5 MG TABLET: 2.5 | 90 days supply | Qty: 270 | Fill #1

## 2017-03-25 MED FILL — NORET-ESTR-FE 0.4-0.035(21): 0.4-35 | 84 days supply | Qty: 112 | Fill #0

## 2017-06-20 MED FILL — BRIELLYN TABLET: 0.4-35 | 63 days supply | Qty: 84 | Fill #2

## 2017-07-23 DIAGNOSIS — J0100 Acute maxillary sinusitis, unspecified: Secondary | ICD-10-CM | POA: Diagnosis not present

## 2017-07-31 DIAGNOSIS — Z01419 Encounter for gynecological examination (general) (routine) without abnormal findings: Secondary | ICD-10-CM | POA: Diagnosis not present

## 2017-07-31 DIAGNOSIS — Z6824 Body mass index (BMI) 24.0-24.9, adult: Secondary | ICD-10-CM | POA: Diagnosis not present

## 2017-08-26 MED FILL — BRIELLYN TABLET: 0.4-35 | 84 days supply | Qty: 84 | Fill #0

## 2017-09-03 ENCOUNTER — Encounter: Payer: Self-pay | Admitting: Internal Medicine

## 2017-09-04 ENCOUNTER — Telehealth: Payer: 59 | Admitting: Nurse Practitioner

## 2017-09-04 DIAGNOSIS — R05 Cough: Secondary | ICD-10-CM

## 2017-09-04 DIAGNOSIS — R059 Cough, unspecified: Secondary | ICD-10-CM

## 2017-09-04 MED ORDER — PREDNISONE 10 MG (21) PO TBPK: ORAL_TABLET | ORAL | 0 refills | Status: DC

## 2017-09-04 NOTE — Progress Notes (Signed)

## 2017-09-06 MED FILL — predniSONE 10 MG TABS: 10 | 6 days supply | Qty: 21 | Fill #0

## 2017-09-19 ENCOUNTER — Ambulatory Visit: Payer: 59 | Admitting: Internal Medicine

## 2017-11-13 DIAGNOSIS — H6063 Unspecified chronic otitis externa, bilateral: Secondary | ICD-10-CM | POA: Diagnosis not present

## 2017-11-13 DIAGNOSIS — Z8352 Family history of ear disorders: Secondary | ICD-10-CM | POA: Diagnosis not present

## 2017-11-13 MED FILL — CLOBETASOL 0.05% SOLUTION: 0.05 | 20 days supply | Qty: 50 | Fill #0

## 2017-11-19 ENCOUNTER — Ambulatory Visit: Payer: 59 | Admitting: Internal Medicine

## 2017-11-19 ENCOUNTER — Encounter: Payer: Self-pay | Admitting: Internal Medicine

## 2017-11-19 VITALS — BP 104/64 | HR 93 | Ht 68.0 in | Wt 154.0 lb

## 2017-11-19 DIAGNOSIS — G901 Familial dysautonomia [Riley-Day]: Secondary | ICD-10-CM

## 2017-11-19 NOTE — Progress Notes (Signed)
Patient Care Team: Merri BrunetteSmith, Candace, MD as PCP - General (Family Medicine) Sharene SkeansHickling, Deanna ArtisWilliam H, MD as Consulting Physician (Pediatrics)   HPI  Stephanie Hudson is a 27 y.o. female Seen in followup for palpitations and exercise intolerance. She  has an elevated blood pressure.  She was being treated with volume repletion and salt with some improvement  She had been much improved.  She She has a history of syncope but not recently.  She has been under an inordinate amount of stress.  She is not sleeping.  She is not exercising.  She is having more lightheadedness.  More fatigue and palpitations    Past Surgical History:  Procedure Laterality Date  . TONSILLECTOMY AND ADENOIDECTOMY  April 20, 2004  . tubes     In ears as baby  . WISDOM TOOTH EXTRACTION  May 24, 2011    Current Outpatient Medications  Medication Sig Dispense Refill  . diphenhydrAMINE (BENADRYL) 25 MG tablet Take 25 mg by mouth every 6 (six) hours as needed (allergies).    . EPINEPHrine 0.3 mg/0.3 mL IJ SOAJ injection Inject 0.3 mLs (0.3 mg total) into the muscle once. For severe allergic reaction 1 Device 0  . midodrine (PROAMATINE) 2.5 MG tablet Take 1 tablet (2.5 mg total) by mouth 3 (three) times daily with meals. 270 tablet 3  . ondansetron (ZOFRAN) 4 MG tablet Take 1 tablet (4 mg total) by mouth every 8 (eight) hours as needed for nausea. 20 tablet 5  . oxyCODONE-acetaminophen (ROXICET) 5-325 MG tablet Take 1/2 to 1 tablet at onset of migraine 20 tablet 0  . PHILITH 0.4-35 MG-MCG tablet Take 1 tablet by mouth daily.  12  . sertraline (ZOLOFT) 25 MG tablet Take one and a half tablets by mouth at bedtime     No current facility-administered medications for this visit.   AE  Allergies  Allergen Reactions  . Latex Anaphylaxis    Review of Systems negative except from HPI and PMH  Physical Exam BP 104/64   Pulse 93   Ht 5\' 8"  (1.727 m)   Wt 154 lb (69.9 kg)   SpO2 99%   BMI 23.42 kg/m  Well developed  and nourished in no acute distress HENT normal Neck supple with JVP-flat Clear Regular rate and rhythm, no murmurs or gallops Abd-soft with active BS No Clubbing cyanosis edema Skin-warm and dry A & Oriented  Grossly normal sensory and motor function  ECG demonstrates sinus rhythm at 71 Intervals 14/09/43    Assessment and  Plan  Postural orthostatic tachycardia/Dysautonomia   Syncope  Stress   She has been pushing herself extremely hard.  This is likely explanation for aggravated palpitations.  The interaction between her work patterns and her dysautonomia contributing to her fatigue are hard to sort out.  We have reviewed strategies again to try to address the autonomic dysfunction including the importance of exercise, sleep,  I will write her a letter for work.  We will see her in 6 months.  We spent more than 50% of our >25 min visit in face to face counseling regarding the above

## 2017-11-19 NOTE — Patient Instructions (Signed)
Your physician recommends that you continue on your current medications as directed. Please refer to the Current Medication list given to you today.   Your physician wants you to follow-up in: 6 MONTHS WITH  RENEE URSAY  PA  You will receive a reminder letter in the mail two months in advance. If you don't receive a letter, please call our office to schedule the follow-up appointment.

## 2017-12-02 MED FILL — BRIELLYN TABLET: 0.4-35 | 84 days supply | Qty: 84 | Fill #1

## 2018-01-16 DIAGNOSIS — H6062 Unspecified chronic otitis externa, left ear: Secondary | ICD-10-CM | POA: Diagnosis not present

## 2018-01-16 DIAGNOSIS — Z8352 Family history of ear disorders: Secondary | ICD-10-CM | POA: Diagnosis not present

## 2018-01-18 MED FILL — SERTRALINE HCL 50 MG TABLET: 50 | 90 days supply | Qty: 135 | Fill #1

## 2018-01-31 DIAGNOSIS — J3089 Other allergic rhinitis: Secondary | ICD-10-CM | POA: Diagnosis not present

## 2018-01-31 DIAGNOSIS — Z91040 Latex allergy status: Secondary | ICD-10-CM | POA: Diagnosis not present

## 2018-01-31 DIAGNOSIS — R21 Rash and other nonspecific skin eruption: Secondary | ICD-10-CM | POA: Diagnosis not present

## 2018-02-10 MED FILL — BRIELLYN TABLET: 0.4-35 | 84 days supply | Qty: 84 | Fill #2

## 2018-02-12 DIAGNOSIS — T7840XA Allergy, unspecified, initial encounter: Secondary | ICD-10-CM | POA: Diagnosis not present

## 2018-02-12 DIAGNOSIS — R0989 Other specified symptoms and signs involving the circulatory and respiratory systems: Secondary | ICD-10-CM | POA: Diagnosis not present

## 2018-02-12 DIAGNOSIS — R1310 Dysphagia, unspecified: Secondary | ICD-10-CM | POA: Diagnosis not present

## 2018-03-05 DIAGNOSIS — B001 Herpesviral vesicular dermatitis: Secondary | ICD-10-CM | POA: Diagnosis not present

## 2018-03-14 DIAGNOSIS — S92512A Displaced fracture of proximal phalanx of left lesser toe(s), initial encounter for closed fracture: Secondary | ICD-10-CM | POA: Diagnosis not present

## 2018-03-14 DIAGNOSIS — S99922A Unspecified injury of left foot, initial encounter: Secondary | ICD-10-CM | POA: Diagnosis not present

## 2018-03-14 DIAGNOSIS — M79675 Pain in left toe(s): Secondary | ICD-10-CM | POA: Diagnosis not present

## 2018-03-14 DIAGNOSIS — Y33XXXA Other specified events, undetermined intent, initial encounter: Secondary | ICD-10-CM | POA: Diagnosis not present

## 2018-03-20 DIAGNOSIS — S92512A Displaced fracture of proximal phalanx of left lesser toe(s), initial encounter for closed fracture: Secondary | ICD-10-CM | POA: Diagnosis not present

## 2018-03-26 ENCOUNTER — Other Ambulatory Visit: Payer: Self-pay | Admitting: *Deleted

## 2018-03-26 ENCOUNTER — Other Ambulatory Visit: Payer: Self-pay | Admitting: Internal Medicine

## 2018-03-26 MED ORDER — MIDODRINE HCL 2.5 MG PO TABS: 2.5000 mg | ORAL_TABLET | Freq: Three times a day (TID) | ORAL | 2 refills | Status: DC

## 2018-03-26 MED FILL — MIDODRINE HCL 2.5 MG TABLET: 2.5 | 90 days supply | Qty: 270 | Fill #0

## 2018-04-10 DIAGNOSIS — S92512D Displaced fracture of proximal phalanx of left lesser toe(s), subsequent encounter for fracture with routine healing: Secondary | ICD-10-CM | POA: Diagnosis not present

## 2018-04-11 MED FILL — BRIELLYN TABLET: 0.4-35 | 63 days supply | Qty: 84 | Fill #3

## 2018-07-15 ENCOUNTER — Ambulatory Visit: Payer: 59 | Admitting: Physician Assistant

## 2018-07-30 NOTE — Progress Notes (Signed)
Cardiology Office Note Date:  07/31/2018  Patient ID:  Stephanie, Hudson 03/08/1991, MRN [id number] PCP:  Merri Brunette, MD  Electrophysiologist:  Dr. Graciela Husbands    Chief Complaint: 6 mo visit  History of Present Illness: Stephanie Hudson is a 27 y.o. female with history of POTS.  She comes in today to be seen for Dr. Graciela Husbands.  She last saw him in jan 2019, at that visit noted increased personal stress with reduced sleep, less exercise with an increased in lightheadedness, fatigue and palpitations.  No syncope. Dr. Graciela Husbands noted: "The interaction between her work patterns and her dysautonomia contributing to her fatigue are hard to sort out.  We have reviewed strategies again to try to address the autonomic dysfunction including the importance of exercise, sleep"  She was given a note for work.  She reports since last seeing Dr. Graciela Husbands she is doing remarkable better.  Reports she was working 60/hrs week, in 2018 had only 2 days off, and when he medically insisted on taking a few days off she with that time realized a huge difference.  She is now at a different job that she loves, had a great summer.  She gets a little dizzy with position changes intermittently but no real appreciable palpitations/tachy issues.  No syncope.   Orthostatic vitals today negative  Past Medical History:  Diagnosis Date  . Anemia   . Asthma   . Chronic headaches   . Concussion May 2002  . Dysmenorrhea   . Dyspnea   . Fatigue   . Headache(784.0)   . History of chicken pox   . Hx of menorrhagia   . Low iron   . Palpitations   . Scoliosis     Past Surgical History:  Procedure Laterality Date  . TONSILLECTOMY AND ADENOIDECTOMY  April 20, 2004  . tubes     In ears as baby  . WISDOM TOOTH EXTRACTION  May 24, 2011    Current Outpatient Medications  Medication Sig Dispense Refill  . diphenhydrAMINE (BENADRYL) 25 MG tablet Take 25 mg by mouth every 6 (six) hours as needed (allergies).    .  EPINEPHrine 0.3 mg/0.3 mL IJ SOAJ injection Inject 0.3 mLs (0.3 mg total) into the muscle once. For severe allergic reaction 1 Device 0  . midodrine (PROAMATINE) 2.5 MG tablet TAKE 1 TABLET BY MOUTH 3 TIMES DAILY WITH MEALS 270 tablet 2  . ondansetron (ZOFRAN) 4 MG tablet Take 1 tablet (4 mg total) by mouth every 8 (eight) hours as needed for nausea. 20 tablet 5  . PHILITH 0.4-35 MG-MCG tablet Take 1 tablet by mouth daily.  12  . sertraline (ZOLOFT) 25 MG tablet Take one and a half tablets by mouth at bedtime     No current facility-administered medications for this visit.     Allergies:   Latex   Social History:  The patient  reports that she has never smoked. She has never used smokeless tobacco. She reports that she does not drink alcohol or use drugs.   Family History:  The patient's family history includes Cancer - Other in her maternal grandfather; Stroke in her paternal grandfather.  ROS:  Please see the history of present illness.  All other systems are reviewed and otherwise negative.   PHYSICAL EXAM:  VS:  BP 112/76   Pulse 83   Ht 5' 7.5" (1.715 m)   Wt 163 lb (73.9 kg)   BMI 25.15 kg/m  BMI: Body mass  index is 25.15 kg/m. Well nourished, well developed, in no acute distress  HEENT: normocephalic, atraumatic  Neck: no JVD, carotid bruits or masses Cardiac:  RRR; no significant murmurs, no rubs, or gallops Lungs:  CTA b/l, no wheezing, rhonchi or rales  Abd: soft, nontender MS: no deformity or atrophy Ext: no edema  Skin: warm and dry, no rash Neuro:  No gross deficits appreciated Psych: euthymic mood, full affect   EKG:  Not done today  10/16/11; TTE Study Conclusions - Left ventricle: The cavity size was normal. Systolic function was normal. The estimated ejection fraction was in the range of 55% to 60%. Wall motion was normal; there were no regional wall motion abnormalities. - Atrial septum: No defect or patent foramen ovale  was identified.  Recent Labs: No results found for requested labs within last 8760 hours.  No results found for requested labs within last 8760 hours.   CrCl cannot be calculated (Patient's most recent lab result is older than the maximum 21 days allowed.).   Wt Readings from Last 3 Encounters:  07/31/18 163 lb (73.9 kg)  11/19/17 154 lb (69.9 kg)  07/03/16 157 lb 6.4 oz (71.4 kg)     Other studies reviewed: Additional studies/records reviewed today include: summarized above  ASSESSMENT AND PLAN:  1. POTS, Dysautonomia     Since changing her job with a much better work-life balance she has done markedly better We revisited strategies of symptom reduction and safety.  She is very knowledgeable about this.  Her and her husband are starting to discuss perhaps starting a family, not now, but perhaps next year.  She is asked to let us know if she becomes pregnant/or when planning to try.  She will see Dr. Graciela HusbandsKlein in 6 mo, sooner if needed.       Disposition: as above  Current medicines are reviewed at length with the patient today.  The patient did not have any concerns regarding medicines.  Norma FredricksonSigned, Nikayla Madaris, PA-C 07/31/2018 1:15 PM     [facility] [address] [place] [address] [phone] (office)  [phone]) [phone] (fax)

## 2018-07-31 ENCOUNTER — Ambulatory Visit: Payer: 59 | Admitting: Physician Assistant

## 2018-07-31 VITALS — BP 112/76 | HR 83 | Ht 67.5 in | Wt 163.0 lb

## 2018-07-31 DIAGNOSIS — G901 Familial dysautonomia [Riley-Day]: Secondary | ICD-10-CM

## 2018-07-31 DIAGNOSIS — G90A Postural orthostatic tachycardia syndrome (POTS): Secondary | ICD-10-CM

## 2018-07-31 DIAGNOSIS — R000 Tachycardia, unspecified: Secondary | ICD-10-CM

## 2018-07-31 DIAGNOSIS — I951 Orthostatic hypotension: Secondary | ICD-10-CM

## 2018-07-31 MED ORDER — MIDODRINE HCL 2.5 MG PO TABS: ORAL_TABLET | ORAL | 2 refills | Status: DC

## 2018-07-31 MED FILL — MIDODRINE HCL 2.5 MG TABLET: 2.5 | 90 days supply | Qty: 270 | Fill #0

## 2018-07-31 NOTE — Patient Instructions (Signed)
Medication Instructions:   Your physician recommends that you continue on your current medications as directed. Please refer to the Current Medication list given to you today.   If you need a refill on your cardiac medications before your next appointment, please call your pharmacy.  Labwork: NONE ORDERED  TODAY    Testing/Procedures: NONE ORDERED  TODAY    Follow-Up: Your physician wants you to follow-up in:  IN 6  MONTHS WITH DR Mervin HackjKLEIN  You will receive a reminder letter in the mail two months in advance. If you don't receive a letter, please call our office to schedule the follow-up appointment.    Any Other Special Instructions Will Be Listed Below (If Applicable).

## 2018-08-28 MED FILL — BRIELLYN TABLET: 0.4-35 | 84 days supply | Qty: 112 | Fill #0

## 2018-12-04 MED FILL — BRIELLYN TABLET: 0.4-35 | 84 days supply | Qty: 112 | Fill #1

## 2018-12-04 MED FILL — MIDODRINE HCL 2.5 MG TABLET: 2.5 | 90 days supply | Qty: 270 | Fill #1

## 2019-01-28 ENCOUNTER — Telehealth: Payer: Self-pay

## 2019-01-28 MED ORDER — MIDODRINE HCL 2.5 MG PO TABS: ORAL_TABLET | ORAL | 3 refills | Status: DC

## 2019-01-28 NOTE — Telephone Encounter (Signed)
Spoke with pt to assess pt's needs. She states she is feeling better than she ever has. She has finally found the best combo of exercise and medications that help to control her symptoms. She is planning on getting pregnant this year and would like to speak with Dr Graciela Husbands regarding symptom mgt during pregnancy.   Pt has agreed to post pone her upcoming appt due to the COVID-19 outbreak. She will call in the next few weeks to reschedule or speak with Dr Graciela Husbands for a tele visit. She understands she may call the office for advisement in the meantime.

## 2019-01-28 NOTE — Addendum Note (Signed)
Addended by: Oretha Milch on: 01/28/2019 03:45 PM   Modules accepted: Orders

## 2019-02-05 ENCOUNTER — Ambulatory Visit: Payer: BC Managed Care – PPO | Admitting: Internal Medicine

## 2019-02-12 ENCOUNTER — Telehealth (INDEPENDENT_AMBULATORY_CARE_PROVIDER_SITE_OTHER): Payer: BC Managed Care – PPO | Admitting: Internal Medicine

## 2019-02-12 ENCOUNTER — Encounter: Payer: Self-pay | Admitting: Internal Medicine

## 2019-02-12 ENCOUNTER — Other Ambulatory Visit: Payer: Self-pay

## 2019-02-12 VITALS — BP 118/78 | HR 85

## 2019-02-12 DIAGNOSIS — R000 Tachycardia, unspecified: Secondary | ICD-10-CM | POA: Diagnosis not present

## 2019-02-12 DIAGNOSIS — G90A Postural orthostatic tachycardia syndrome (POTS): Secondary | ICD-10-CM

## 2019-02-12 DIAGNOSIS — I951 Orthostatic hypotension: Secondary | ICD-10-CM

## 2019-02-12 DIAGNOSIS — G901 Familial dysautonomia [Riley-Day]: Secondary | ICD-10-CM

## 2019-02-12 NOTE — Progress Notes (Signed)
Electrophysiology TeleHealth Note   Due to national recommendations of social distancing due to COVID 19, an audio/video telehealth visit is felt to be most appropriate for this patient at this time.  See MyChart message from today for the patient's consent to telehealth for [facility].   Date:  02/13/2019   ID:  Tama Headings, DOB [birth year], MRN [id number]  Location: patient's home  Provider location: [address] [place]  Evaluation Performed: Follow-up visit  PCP:  Merri Brunette, MD  Cardiologist:   Electrophysiologist:   sk Chief Complaint:  dysautonomia  History of Present Illness:    Stephanie Hudson is a 28 y.o. female who presents via audio/video conferencing for a telehealth visit today.  Since last being seen in our clinic, the patient reports doing as well as she has in some years  Minimal dizziness  Exercising vigorously;  Tolerated heat last week w great aplomb  Wanting to get pregnant and spoke with Dr Llana Aliment who asked that we discuss her meds--specifically proamatine     The patient denies symptoms of fevers, chills, cough, or new SOB worrisome for COVID 19   Past Medical History:  Diagnosis Date  . Anemia   . Asthma   . Chronic headaches   . Concussion May 2002  . Dysmenorrhea   . Dyspnea   . Fatigue   . Headache(784.0)   . History of chicken pox   . Hx of menorrhagia   . Low iron   . Palpitations   . Scoliosis     Past Surgical History:  Procedure Laterality Date  . TONSILLECTOMY AND ADENOIDECTOMY  April 20, 2004  . tubes     In ears as baby  . WISDOM TOOTH EXTRACTION  May 24, 2011    Current Outpatient Medications  Medication Sig Dispense Refill  . diphenhydrAMINE (BENADRYL) 25 MG tablet Take 25 mg by mouth every 6 (six) hours as needed (allergies).    . EPINEPHrine 0.3 mg/0.3 mL IJ SOAJ injection Inject 0.3 mLs (0.3 mg total) into the muscle once. For severe allergic reaction 1 Device 0  .  midodrine (PROAMATINE) 2.5 MG tablet TAKE 1 TABLET BY MOUTH 3 TIMES DAILY WITH MEALS 270 tablet 3  . norethindrone-ethinyl estradiol (OVCON-35,BALZIVA,BRIELLYN) 0.4-35 MG-MCG tablet Take 1 tablet by mouth daily.    . sertraline (ZOLOFT) 25 MG tablet Take one and a half tablets by mouth at bedtime     No current facility-administered medications for this visit.     Allergies:   Latex   Social History:  The patient  reports that she has never smoked. She has never used smokeless tobacco. She reports that she does not drink alcohol or use drugs.   Family History:  The patient's   family history includes Cancer - Other in her maternal grandfather; Stroke in her paternal grandfather.   ROS:  Please see the history of present illness.   All other systems are personally reviewed and negative.    Exam:    Vital Signs:  BP 118/78 (Patient Position: Standing) Comment (Patient Position): 3 minues standing  Pulse 85     Well appearing, alert and conversant, regular work of breathing,  good skin color Eyes- anicteric, neuro- grossly intact, skin- no apparent rash or lesions or cyanosis, mouth- oral mucosa is pink   Labs/Other Tests and Data Reviewed:    Recent Labs: No results found for requested labs within last 8760 hours.  Wt Readings from Last 3 Encounters:  07/31/18 163 lb (73.9 kg)  11/19/17 154 lb (69.9 kg)  07/03/16 157 lb 6.4 oz (71.4 kg)     Other studies personally reviewed:       ASSESSMENT & PLAN:    Postural orthostatic tachycardia/Dysautonomia   Syncope  Stress  Symptoms are largely quiescent-- she thinks 2/2 to vigorous aerobic exercise regime  STress is much less  Continues to work of fluid and salt repletion  She and her husband are thinking about pregnancy.  We discussed the physiology as relates to dysautonomia with fluid retention and vaso dilitation.  Did a quick literature search reporting the safe use of proamantine during pregnancy       COVID 19 screen The patient denies symptoms of COVID 19 at this time.  The importance of social distancing was discussed today.  Follow-up:  7m    Current medicines are reviewed at length with the patient today.   The patient has concerns regarding her medicines.  The following changes were made today:  none    Patient Risk:  after full review of this patients clinical status, I feel that they are at moderate risk at this time.  Today, I have spent 16 minutes with the patient with telehealth technology discussing the above.  Signed, Sherryl Manges, MD  02/13/2019 10:06 AM     [facility] [address] [place] [address] [phone] (office) [phone] (fax)

## 2019-07-17 LAB — OB RESULTS CONSOLE HIV ANTIBODY (ROUTINE TESTING): HIV: NONREACTIVE

## 2019-07-17 LAB — OB RESULTS CONSOLE RUBELLA ANTIBODY, IGM: Rubella: IMMUNE

## 2019-07-17 LAB — OB RESULTS CONSOLE ABO/RH: RH Type: POSITIVE

## 2019-07-17 LAB — OB RESULTS CONSOLE ANTIBODY SCREEN: Antibody Screen: NEGATIVE

## 2019-07-17 LAB — OB RESULTS CONSOLE GC/CHLAMYDIA
Chlamydia: NEGATIVE
Gonorrhea: NEGATIVE

## 2019-07-17 LAB — OB RESULTS CONSOLE RPR: RPR: NONREACTIVE

## 2019-07-17 LAB — OB RESULTS CONSOLE HEPATITIS B SURFACE ANTIGEN: Hepatitis B Surface Ag: NEGATIVE

## 2019-09-07 ENCOUNTER — Telehealth: Payer: Self-pay | Admitting: Internal Medicine

## 2019-09-07 NOTE — Telephone Encounter (Signed)
New Message  Pt c/o BP issue: STAT if pt c/o blurred vision, one-sided weakness or slurred speech  1. What are your last 5 BP readings? 100/60 HR 89, 87/58 HR 92, 102/70 HR 87, 96/68, 103/89 HR 91.  2. Are you having any other symptoms (ex. Dizziness, headache, blurred vision, passed out)? Dizziness/lightheadedness, Nausea, vomiting, headaches,   3. What is your BP issue? Blood Pressure Low (within the last week) Patient states that she is pregnant and has came off of her blood pressure medication via her OBGYN orders 10 weeks ago. Please give patient a call back to discuss.

## 2019-09-07 NOTE — Telephone Encounter (Signed)
The patient, who is a "POTS" is calling because she is pregnant and her OBGYN had stopped her Midodrine 2.5 mg medication about 10 weeks ago.  Her doctor said that her pregnancy would increase her BP and she did not want it too elevated on the medication.    Her BP was well controlled up until 1 week ago where it has been in the 37S-283T systolic, HR in the 51V-61Y.  She has become symptomatic with dizziness, lightheadedness, nausea, vomiting and headaches.    On Saturday her BP was 87/58 and nothing would bring it up, so she took 1 Midodrine and it helped.  Her OBGYN said that she should reach out to you and is ok with a less frequency of the Midodrine or any other recommendations.  Please advise, thank you.

## 2019-09-11 NOTE — Telephone Encounter (Signed)
I spoke to the patient with Dr Olin Pia recommendations.  She verbalized understanding.

## 2019-09-11 NOTE — Telephone Encounter (Signed)
2 Q  1) how far along is she, typically the 1st trimester can be problem because of fluid intake 2/2 N&V  Increase fluids with LIQUID IV or PEDIALYTE ADVANCED CARE 2) I found scant information on midodrine in pregnancy but using it minimally as necessary is probably ok   Second trimesster in normally better and CONGRATULATIONS

## 2019-11-08 ENCOUNTER — Encounter (HOSPITAL_COMMUNITY): Payer: Self-pay | Admitting: Obstetrics and Gynecology

## 2019-11-08 ENCOUNTER — Inpatient Hospital Stay
Admission: AD | Admit: 2019-11-08 | Payer: BC Managed Care – PPO | Source: Home / Self Care | Admitting: Obstetrics and Gynecology

## 2019-11-08 ENCOUNTER — Inpatient Hospital Stay (HOSPITAL_COMMUNITY)
Admission: AD | Admit: 2019-11-08 | Discharge: 2019-11-08 | Disposition: A | Discharge status: Home / Self Care | Payer: BC Managed Care – PPO | Attending: Obstetrics and Gynecology | Admitting: Obstetrics and Gynecology

## 2019-11-08 ENCOUNTER — Other Ambulatory Visit: Payer: Self-pay

## 2019-11-08 DIAGNOSIS — Z0371 Encounter for suspected problem with amniotic cavity and membrane ruled out: Secondary | ICD-10-CM

## 2019-11-08 DIAGNOSIS — O98812 Other maternal infectious and parasitic diseases complicating pregnancy, second trimester: Secondary | ICD-10-CM | POA: Insufficient documentation

## 2019-11-08 DIAGNOSIS — Z3A25 25 weeks gestation of pregnancy: Secondary | ICD-10-CM | POA: Insufficient documentation

## 2019-11-08 DIAGNOSIS — Z79899 Other long term (current) drug therapy: Secondary | ICD-10-CM | POA: Diagnosis not present

## 2019-11-08 DIAGNOSIS — O368120 Decreased fetal movements, second trimester, not applicable or unspecified: Secondary | ICD-10-CM | POA: Insufficient documentation

## 2019-11-08 DIAGNOSIS — B3731 Acute candidiasis of vulva and vagina: Secondary | ICD-10-CM

## 2019-11-08 DIAGNOSIS — B373 Candidiasis of vulva and vagina: Secondary | ICD-10-CM | POA: Insufficient documentation

## 2019-11-08 LAB — WET PREP, GENITAL
Clue Cells Wet Prep HPF POC: NONE SEEN
Sperm: NONE SEEN
Trich, Wet Prep: NONE SEEN
Yeast Wet Prep HPF POC: NONE SEEN

## 2019-11-08 LAB — URINALYSIS, ROUTINE W REFLEX MICROSCOPIC
Bilirubin Urine: NEGATIVE
Glucose, UA: NEGATIVE mg/dL
Hgb urine dipstick: NEGATIVE
Ketones, ur: NEGATIVE mg/dL
Nitrite: NEGATIVE
Protein, ur: NEGATIVE mg/dL
Specific Gravity, Urine: 1.006 (ref 1.005–1.030)
pH: 7 (ref 5.0–8.0)

## 2019-11-08 NOTE — MAU Provider Note (Signed)
History     CSN: [id number]  Arrival date and time: 11/08/19 1057   First Provider Initiated Contact with Patient 11/08/19 1130      Chief Complaint  Patient presents with  . Decreased Fetal Movement  . Nausea  . Vaginal Discharge   HPI Stephanie Hudson is a 28 y.o. G1P0000 at [redacted]w[redacted]d who presents with decreased fetal movement and vaginal discharge. She states she felt no movement from 1600-2300 last night. Still minimal movement this am. Reports feeling normal movement since her arrival to MAU. She also reports vaginal discharge that went through her clothes. She reports a large amount of discharge normally but states this was more. She also reports stopping Bonjesta 3 weeks ago and feeling persistent nausea and decreased appetite since then. Denies any vomiting. She denies any abdominal pain or vaginal bleeding.   OB History    Gravida  1   Para  0   Term  0   Preterm  0   AB  0   Living  0     SAB  0   TAB  0   Ectopic  0   Multiple  0   Live Births              Past Medical History:  Diagnosis Date  . Anemia   . Asthma   . Chronic headaches   . Concussion May 2002  . Dysmenorrhea   . Dyspnea   . Fatigue   . Headache(784.0)   . History of chicken pox   . Hx of menorrhagia   . Low iron   . Palpitations   . Scoliosis     Past Surgical History:  Procedure Laterality Date  . TONSILLECTOMY AND ADENOIDECTOMY  April 20, 2004  . tubes     In ears as baby  . WISDOM TOOTH EXTRACTION  May 24, 2011    Family History  Problem Relation Age of Onset  . Cancer - Other Maternal Grandfather        Died at 54-Brain Tumor  . Stroke Paternal Grandfather        Died at 28    Social History   Tobacco Use  . Smoking status: Never Smoker  . Smokeless tobacco: Never Used  Substance Use Topics  . Alcohol use: No  . Drug use: No    Allergies:  Allergies  Allergen Reactions  . Latex Anaphylaxis    Medications Prior to Admission  Medication Sig  Dispense Refill Last Dose  . diphenhydrAMINE (BENADRYL) 25 MG tablet Take 25 mg by mouth every 6 (six) hours as needed (allergies).     . EPINEPHrine 0.3 mg/0.3 mL IJ SOAJ injection Inject 0.3 mLs (0.3 mg total) into the muscle once. For severe allergic reaction 1 Device 0   . midodrine (PROAMATINE) 2.5 MG tablet TAKE 1 TABLET BY MOUTH 3 TIMES DAILY WITH MEALS 270 tablet 3   . norethindrone-ethinyl estradiol (OVCON-35,BALZIVA,BRIELLYN) 0.4-35 MG-MCG tablet Take 1 tablet by mouth daily.     . sertraline (ZOLOFT) 25 MG tablet Take one and a half tablets by mouth at bedtime       Review of Systems  Constitutional: Negative.  Negative for fatigue and fever.  HENT: Negative.   Respiratory: Negative.  Negative for shortness of breath.   Cardiovascular: Negative.  Negative for chest pain.  Gastrointestinal: Positive for nausea. Negative for abdominal pain, constipation, diarrhea and vomiting.  Genitourinary: Positive for vaginal discharge. Negative for dysuria.  Neurological: Negative.  Negative for dizziness and headaches.   Physical Exam   Blood pressure 120/66, pulse (!) 112, temperature 98.5 F (36.9 C), resp. rate 16, SpO2 99 %.  Physical Exam  Nursing note and vitals reviewed. Constitutional: She is oriented to person, place, and time. She appears well-developed and well-nourished. No distress.  HENT:  Head: Normocephalic.  Eyes: Pupils are equal, round, and reactive to light.  Cardiovascular: Normal rate, regular rhythm and normal heart sounds.  Respiratory: Effort normal and breath sounds normal. No respiratory distress.  GI: Soft. Bowel sounds are normal. She exhibits no distension. There is no abdominal tenderness.  Genitourinary:    Vaginal discharge (Thick white adherent discharge around labia) present.     Genitourinary Comments: SSE: moderate amount of creamy white discharge in vault. Cervix visually closed.    Neurological: She is alert and oriented to person, place, and  time.  Skin: Skin is warm and dry.  Psychiatric: She has a normal mood and affect. Her behavior is normal. Judgment and thought content normal.   Fetal Tracing:  Baseline: 140 Variability: moderate Accels: 10x10 Decels: none  Toco: none   MAU Course  Procedures Results for orders placed or performed during the hospital encounter of 11/08/19 (from the past 24 hour(s))  Urinalysis, Routine w reflex microscopic     Status: Abnormal   Collection Time: 11/08/19 11:10 AM  Result Value Ref Range   Color, Urine YELLOW YELLOW   APPearance HAZY (A) CLEAR   Specific Gravity, Urine 1.006 1.005 - 1.030   pH 7.0 5.0 - 8.0   Glucose, UA NEGATIVE NEGATIVE mg/dL   Hgb urine dipstick NEGATIVE NEGATIVE   Bilirubin Urine NEGATIVE NEGATIVE   Ketones, ur NEGATIVE NEGATIVE mg/dL   Protein, ur NEGATIVE NEGATIVE mg/dL   Nitrite NEGATIVE NEGATIVE   Leukocytes,Ua LARGE (A) NEGATIVE   RBC / HPF 0-5 0 - 5 RBC/hpf   WBC, UA 11-20 0 - 5 WBC/hpf   Bacteria, UA MANY (A) NONE SEEN   Squamous Epithelial / LPF 11-20 0 - 5   Mucus PRESENT   Wet prep, genital     Status: Abnormal   Collection Time: 11/08/19 11:39 AM  Result Value Ref Range   Yeast Wet Prep HPF POC NONE SEEN NONE SEEN   Trich, Wet Prep NONE SEEN NONE SEEN   Clue Cells Wet Prep HPF POC NONE SEEN NONE SEEN   WBC, Wet Prep HPF POC MANY (A) NONE SEEN   Sperm NONE SEEN    MDM UA Wet prep NST reassuring for gestational age, patient reporting normal fetal movement.  SSE: no pooling, negative for SROM Patient declines nausea at this time.   Discussed normalcy of vaginal discharge in pregnancy. Encouraged patient to wear pad daily and reviewed signs of SROM at length.   Reviewed normal fetal movements at this gestational age and reassurance provided.   Assessment and Plan   1. Encounter for suspected premature rupture of amniotic membranes, with rupture of membranes not found   2. Candidiasis of vulva   3. Decreased fetal movements in  second trimester, single or unspecified fetus   4. [redacted] weeks gestation of pregnancy    -Discharge home in stable condition -Encouraged patient to use external yeast infection cream -Fetal movement precautions discussed -Patient advised to follow-up with [facility] as scheduled for prenatal care -Patient may return to MAU as needed or if her condition were to change or worsen  Wende Mott CNM 11/08/2019, 12:07 PM

## 2019-11-08 NOTE — MAU Note (Signed)
Stephanie Hudson is a 28 y.o. at around 25 weeks here in MAU reporting:  +decreased fetal movement 4p-11pm felt no movement 4 movements from 11pm-1230am Did try kick counts and caffeine during these times. +nausea for a few days +vaginal discharge. Endorses heavier discharge this pregnancy but yesterday had to change her underpants and clothes. Pain score: denies Vitals:   11/08/19 1106  BP: 120/66  Pulse: (!) 112  Resp: 16  Temp: 98.5 F (36.9 C)  SpO2: 99%     FHT: 157 via doppler Lab orders placed from triage: ua

## 2019-11-08 NOTE — Discharge Instructions (Signed)

## 2019-11-13 NOTE — L&D Delivery Note (Signed)
Vtx at +3 station and ROA.  Pt desires VE.  I discussed the R&B of VE including but not limited to injury to fetus but the potential benefit of expedited delivery.  She gives her informed consent and wishes to proceed. On the 5th pull delivered viable female apgars 8,9 over partial 3rd degree lac   Placenta delivered spontaneously intact with 3VC. Repair with 2-0, 3-0 and 0 Chromic with good support and hemostasis noted and R/V exam confirms.  PH art was sent..  Mother and baby were doing well.  EBL 450cc  Candice Camp, MD

## 2019-12-31 ENCOUNTER — Telehealth: Payer: Self-pay | Admitting: Internal Medicine

## 2019-12-31 NOTE — Telephone Encounter (Signed)
The patient reports she is [redacted] weeks pregnant tomorrow. She had an appointment with Dr. Vincente Poli recently and was instructed to see Dr. Graciela Husbands ASAP for evaluation of increased HR.  He resting HR at home is never under 100 bpm if she is awake. Sitting on the couch, its around 113 bpm. Driving the other day, it was around 140-150 bpm. She restarted midodrine and her BP is usually around 120s/70s. She is taking 2.5 mg BID.  Dr. Vincente Poli started her on labetolol but the patient could not remember dosage.   Will request Dr. Lynnell Dike OV note. The patient will bring her HR/BP/activity log to her visit with Dr. Graciela Husbands scheduled next Tuesday.  She was grateful for assistance.

## 2019-12-31 NOTE — Telephone Encounter (Signed)
New Message    Pt is calling and states her OBGYN wants her to be seen as soon as possible  She says she will be [redacted] weeks pregnant tomorrow and has been experiencing Tachycardia  I scheduled pt with the next available with a PA, Maxine Glenn on 03/02    Please call

## 2020-01-04 ENCOUNTER — Telehealth: Payer: Self-pay | Admitting: Internal Medicine

## 2020-01-04 NOTE — Telephone Encounter (Signed)
Medical records requested from [facility] on 01/01/20. Called facility to follow up. Left message for a call back. 01/04/20 vlm

## 2020-01-05 ENCOUNTER — Encounter: Payer: Self-pay | Admitting: Internal Medicine

## 2020-01-05 ENCOUNTER — Ambulatory Visit: Payer: BC Managed Care – PPO | Admitting: Internal Medicine

## 2020-01-05 ENCOUNTER — Other Ambulatory Visit: Payer: Self-pay

## 2020-01-05 VITALS — BP 111/56 | HR 93 | Ht 67.5 in | Wt 193.0 lb

## 2020-01-05 DIAGNOSIS — G901 Familial dysautonomia [Riley-Day]: Secondary | ICD-10-CM

## 2020-01-05 NOTE — Progress Notes (Addendum)
Patient Care Team: Merri Brunette, MD as PCP - General (Family Medicine) Sharene Skeans Deanna Artis, MD as Consulting Physician (Pediatrics)   HPI  Stephanie Hudson is a 29 y.o. female Seen in followup for palpitations and exercise intolerance. She  has an elevated blood pressure.  She was being treated with volume repletion and salt with some improvement  She had been much improved.  She She has a history of syncope but not recently.  She has been under an inordinate amount of stress.  She is not sleeping.  She is not exercising.  She is having more lightheadedness.  More fatigue and palpitations    Past Surgical History:  Procedure Laterality Date  . TONSILLECTOMY AND ADENOIDECTOMY  April 20, 2004  . tubes     In ears as baby  . WISDOM TOOTH EXTRACTION  May 24, 2011    Current Outpatient Medications  Medication Sig Dispense Refill  . Doxylamine-Pyridoxine 10-10 MG TBEC Take by mouth daily.     Marland Kitchen EPINEPHrine 0.3 mg/0.3 mL IJ SOAJ injection Inject 0.3 mLs (0.3 mg total) into the muscle once. For severe allergic reaction 1 Device 0  . labetalol (NORMODYNE) 200 MG tablet Take 200 mg by mouth 2 (two) times daily.     . midodrine (PROAMATINE) 2.5 MG tablet Take 2.5 mg by mouth 3 (three) times daily with meals.    . sertraline (ZOLOFT) 100 MG tablet Take 100 mg by mouth daily.      No current facility-administered medications for this visit.  AE  Allergies  Allergen Reactions  . Latex Anaphylaxis    Review of Systems negative except from HPI and PMH  Physical Exam BP (!) 111/56   Pulse 93   Ht 5' 7.5" (1.715 m)   Wt 193 lb (87.5 kg)   SpO2 96%   BMI 29.78 kg/m  Well developed and nourished in no acute distress HENT normal Neck supple with JVP-flat Clear Regular rate and rhythm, no murmurs or gallops Abd-soft with active BS No Clubbing cyanosis 2+ edema Skin-warm and dry A & Oriented  Grossly normal sensory and motor function  ECG demonstrates sinus rhythm at  71 Intervals 14/09/43    Assessment and  Plan  Postural orthostatic tachycardia Dysautonomia   Syncope  Pregnancy  Swelling    Patient is struggling with her pregnancy associated hemodynamics.  She is very swollen.  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  We will need to discuss with OB/GYN as to whether compression wear of her thighs and/or calves  We reviewed the physiology of pregnancy and the likely explanations relaed to dysautonomia   Will need to discuss with OBGYN Re ? preeclamsia

## 2020-01-05 NOTE — Patient Instructions (Signed)
Medication Instructions:  Your physician recommends that you continue on your current medications as directed. Please refer to the Current Medication list given to you today.  *If you need a refill on your cardiac medications before your next appointment, please call your pharmacy*  Lab Work: None ordered.  If you have labs (blood work) drawn today and your tests are completely normal, you will receive your results only by: Marland Kitchen MyChart Message (if you have MyChart) OR . A paper copy in the mail If you have any lab test that is abnormal or we need to change your treatment, we will call you to review the results.  Testing/Procedures: None ordered.   Follow-Up: At [facility], you and your health needs are our priority.  As part of our continuing mission to provide you with exceptional heart care, we have created designated Provider Care Teams.  These Care Teams include your primary Cardiologist (physician) and Advanced Practice Providers (APPs -  Physician Assistants and Nurse Practitioners) who all work together to provide you with the care you need, when you need it.  Your next appointment:  Will coordinate with your OB-GYN  Provider:  Dr Graciela Husbands   Other Instructions Thigh Sleeves as discussed with Dr Graciela Husbands

## 2020-01-12 ENCOUNTER — Ambulatory Visit: Payer: BC Managed Care – PPO | Admitting: Student

## 2020-01-13 ENCOUNTER — Telehealth: Payer: Self-pay

## 2020-01-13 NOTE — Telephone Encounter (Signed)
NOTES ON FILE FROM [facility] [phone], SENT REFERRAL TO SCHEDULING 

## 2020-01-19 ENCOUNTER — Telehealth: Payer: Self-pay

## 2020-01-19 LAB — OB RESULTS CONSOLE GBS: GBS: NEGATIVE

## 2020-01-19 NOTE — Telephone Encounter (Signed)
Attempted phone call to pt.per Dr Graciela Husbands request to advise pt he has spoken with pt's OB-GYN and both agree pt should purchase thigh sleeves and try to see if this helpful.  Voicemail is full so unable to leave message.

## 2020-02-01 ENCOUNTER — Telehealth (HOSPITAL_COMMUNITY): Payer: Self-pay | Admitting: *Deleted

## 2020-02-01 ENCOUNTER — Encounter (HOSPITAL_COMMUNITY): Payer: Self-pay | Admitting: *Deleted

## 2020-02-01 NOTE — Telephone Encounter (Signed)
Preadmission screen  

## 2020-02-03 ENCOUNTER — Telehealth (HOSPITAL_COMMUNITY): Payer: Self-pay | Admitting: *Deleted

## 2020-02-03 NOTE — Telephone Encounter (Signed)
Asked pt to request written documentation from Dr Wilmon Arms office with pushing reccomendations She stated she will send a message to his office via MyChart this afternoon.

## 2020-02-08 ENCOUNTER — Telehealth (HOSPITAL_COMMUNITY): Payer: Self-pay | Admitting: *Deleted

## 2020-02-08 NOTE — Telephone Encounter (Signed)
Message sent to Dr Graciela Husbands requesting note of Second Stage management for pt.

## 2020-02-09 ENCOUNTER — Telehealth: Payer: Self-pay | Admitting: Internal Medicine

## 2020-02-09 NOTE — Telephone Encounter (Signed)
Amy, Pre-admissions nurse for [facility] was calling for Dr. Graciela Husbands for recommendations for this patient.  She sent Dr. Graciela Husbands a MyChart message and wanted to follow up on that message

## 2020-02-10 ENCOUNTER — Other Ambulatory Visit (HOSPITAL_COMMUNITY)
Admission: RE | Admit: 2020-02-10 | Discharge: 2020-02-10 | Disposition: A | Discharge status: Home / Self Care | Payer: BC Managed Care – PPO | Source: Ambulatory Visit | Attending: Obstetrics and Gynecology | Admitting: Obstetrics and Gynecology

## 2020-02-10 DIAGNOSIS — Z20822 Contact with and (suspected) exposure to covid-19: Secondary | ICD-10-CM | POA: Diagnosis not present

## 2020-02-10 DIAGNOSIS — Z01812 Encounter for preprocedural laboratory examination: Secondary | ICD-10-CM | POA: Diagnosis present

## 2020-02-10 LAB — SARS CORONAVIRUS 2 (TAT 6-24 HRS): SARS Coronavirus 2: NEGATIVE

## 2020-02-12 ENCOUNTER — Inpatient Hospital Stay (HOSPITAL_COMMUNITY)
Admission: AD | Admit: 2020-02-12 | Discharge: 2020-02-14 | DRG: 768 | Disposition: A | Discharge status: Home / Self Care | Payer: BC Managed Care – PPO | Attending: Obstetrics and Gynecology | Admitting: Obstetrics and Gynecology

## 2020-02-12 ENCOUNTER — Encounter (HOSPITAL_COMMUNITY): Payer: Self-pay | Admitting: Obstetrics and Gynecology

## 2020-02-12 ENCOUNTER — Inpatient Hospital Stay (HOSPITAL_COMMUNITY): Payer: BC Managed Care – PPO | Admitting: Anesthesiology

## 2020-02-12 ENCOUNTER — Inpatient Hospital Stay (HOSPITAL_COMMUNITY): Payer: BC Managed Care – PPO

## 2020-02-12 ENCOUNTER — Other Ambulatory Visit: Payer: Self-pay

## 2020-02-12 DIAGNOSIS — O9942 Diseases of the circulatory system complicating childbirth: Secondary | ICD-10-CM | POA: Diagnosis present

## 2020-02-12 DIAGNOSIS — O26893 Other specified pregnancy related conditions, third trimester: Secondary | ICD-10-CM | POA: Diagnosis present

## 2020-02-12 DIAGNOSIS — Z91040 Latex allergy status: Secondary | ICD-10-CM | POA: Diagnosis not present

## 2020-02-12 DIAGNOSIS — D649 Anemia, unspecified: Secondary | ICD-10-CM | POA: Diagnosis present

## 2020-02-12 DIAGNOSIS — I471 Supraventricular tachycardia: Secondary | ICD-10-CM | POA: Diagnosis present

## 2020-02-12 DIAGNOSIS — Z3A39 39 weeks gestation of pregnancy: Secondary | ICD-10-CM | POA: Diagnosis not present

## 2020-02-12 DIAGNOSIS — O9902 Anemia complicating childbirth: Secondary | ICD-10-CM | POA: Diagnosis present

## 2020-02-12 DIAGNOSIS — Z3490 Encounter for supervision of normal pregnancy, unspecified, unspecified trimester: Secondary | ICD-10-CM

## 2020-02-12 LAB — CBC
HCT: 31.5 % — ABNORMAL LOW (ref 36.0–46.0)
Hemoglobin: 9.7 g/dL — ABNORMAL LOW (ref 12.0–15.0)
MCH: 29.6 pg (ref 26.0–34.0)
MCHC: 30.8 g/dL (ref 30.0–36.0)
MCV: 96 fL (ref 80.0–100.0)
Platelets: 292 10*3/uL (ref 150–400)
RBC: 3.28 MIL/uL — ABNORMAL LOW (ref 3.87–5.11)
RDW: 12.7 % (ref 11.5–15.5)
WBC: 9.1 10*3/uL (ref 4.0–10.5)
nRBC: 0 % (ref 0.0–0.2)

## 2020-02-12 LAB — ABO/RH: ABO/RH(D): O POS

## 2020-02-12 LAB — TYPE AND SCREEN
ABO/RH(D): O POS
Antibody Screen: NEGATIVE

## 2020-02-12 LAB — RPR: RPR Ser Ql: NONREACTIVE

## 2020-02-12 MED ORDER — OXYCODONE-ACETAMINOPHEN 5-325 MG PO TABS: 1.0000 | ORAL_TABLET | ORAL | Status: DC | PRN

## 2020-02-12 MED ORDER — ONDANSETRON HCL 4 MG/2ML IJ SOLN: 4.0000 mg | Freq: Four times a day (QID) | INTRAMUSCULAR | Status: DC | PRN

## 2020-02-12 MED ORDER — IBUPROFEN 600 MG PO TABS
600.0000 mg | ORAL_TABLET | Freq: Four times a day (QID) | ORAL | Status: DC
  Administered 2020-02-12 – 2020-02-14 (×7): 600 mg via ORAL
  Filled 2020-02-12 (×7): qty 1

## 2020-02-12 MED ORDER — DIPHENHYDRAMINE HCL 25 MG PO CAPS: 25.0000 mg | ORAL_CAPSULE | Freq: Four times a day (QID) | ORAL | Status: DC | PRN

## 2020-02-12 MED ORDER — OXYTOCIN BOLUS FROM INFUSION
500.0000 mL | Freq: Once | INTRAVENOUS | Status: AC
  Administered 2020-02-12: 500 mL via INTRAVENOUS

## 2020-02-12 MED ORDER — OXYCODONE-ACETAMINOPHEN 5-325 MG PO TABS: 2.0000 | ORAL_TABLET | ORAL | Status: DC | PRN

## 2020-02-12 MED ORDER — EPHEDRINE 5 MG/ML INJ: 10.0000 mg | INTRAVENOUS | Status: DC | PRN

## 2020-02-12 MED ORDER — SENNOSIDES-DOCUSATE SODIUM 8.6-50 MG PO TABS
2.0000 | ORAL_TABLET | ORAL | Status: DC
  Administered 2020-02-12 – 2020-02-13 (×2): 2 via ORAL
  Filled 2020-02-12 (×2): qty 2

## 2020-02-12 MED ORDER — ACETAMINOPHEN 325 MG PO TABS
650.0000 mg | ORAL_TABLET | ORAL | Status: DC | PRN
  Administered 2020-02-13: 650 mg via ORAL
  Filled 2020-02-12: qty 2

## 2020-02-12 MED ORDER — SIMETHICONE 80 MG PO CHEW: 80.0000 mg | CHEWABLE_TABLET | ORAL | Status: DC | PRN

## 2020-02-12 MED ORDER — LACTATED RINGERS IV SOLN: INTRAVENOUS | Status: DC

## 2020-02-12 MED ORDER — MIDODRINE HCL 2.5 MG PO TABS
2.5000 mg | ORAL_TABLET | Freq: Three times a day (TID) | ORAL | Status: DC
  Administered 2020-02-13 – 2020-02-14 (×5): 2.5 mg via ORAL
  Filled 2020-02-12 (×6): qty 1

## 2020-02-12 MED ORDER — SERTRALINE HCL 100 MG PO TABS
100.0000 mg | ORAL_TABLET | Freq: Every day | ORAL | Status: DC
  Administered 2020-02-13: 100 mg via ORAL
  Filled 2020-02-12: qty 1

## 2020-02-12 MED ORDER — FENTANYL-BUPIVACAINE-NACL 0.5-0.125-0.9 MG/250ML-% EP SOLN
EPIDURAL | Status: AC
  Filled 2020-02-12: qty 250

## 2020-02-12 MED ORDER — EPINEPHRINE 0.3 MG/0.3ML IJ SOAJ
0.3000 mg | Freq: Once | INTRAMUSCULAR | Status: DC
  Filled 2020-02-12: qty 0.6

## 2020-02-12 MED ORDER — WITCH HAZEL-GLYCERIN EX PADS: 1.0000 "application " | MEDICATED_PAD | CUTANEOUS | Status: DC | PRN

## 2020-02-12 MED ORDER — LACTATED RINGERS IV SOLN
500.0000 mL | INTRAVENOUS | Status: DC | PRN
  Administered 2020-02-12: 500 mL via INTRAVENOUS

## 2020-02-12 MED ORDER — TERBUTALINE SULFATE 1 MG/ML IJ SOLN: 0.2500 mg | Freq: Once | INTRAMUSCULAR | Status: DC | PRN

## 2020-02-12 MED ORDER — OXYTOCIN 40 UNITS IN NORMAL SALINE INFUSION - SIMPLE MED
1.0000 m[IU]/min | INTRAVENOUS | Status: DC
  Administered 2020-02-12: 2 m[IU]/min via INTRAVENOUS
  Filled 2020-02-12: qty 1000

## 2020-02-12 MED ORDER — BUTORPHANOL TARTRATE 1 MG/ML IJ SOLN: 1.0000 mg | INTRAMUSCULAR | Status: DC | PRN

## 2020-02-12 MED ORDER — ONDANSETRON HCL 4 MG PO TABS: 4.0000 mg | ORAL_TABLET | ORAL | Status: DC | PRN

## 2020-02-12 MED ORDER — COCONUT OIL OIL
1.0000 "application " | TOPICAL_OIL | Status: DC | PRN
  Administered 2020-02-13: 1 via TOPICAL

## 2020-02-12 MED ORDER — ACETAMINOPHEN 325 MG PO TABS: 650.0000 mg | ORAL_TABLET | ORAL | Status: DC | PRN

## 2020-02-12 MED ORDER — BENZOCAINE-MENTHOL 20-0.5 % EX AERO
1.0000 "application " | INHALATION_SPRAY | CUTANEOUS | Status: DC | PRN
  Filled 2020-02-12: qty 56

## 2020-02-12 MED ORDER — DIBUCAINE (PERIANAL) 1 % EX OINT: 1.0000 "application " | TOPICAL_OINTMENT | CUTANEOUS | Status: DC | PRN

## 2020-02-12 MED ORDER — ZOLPIDEM TARTRATE 5 MG PO TABS: 5.0000 mg | ORAL_TABLET | Freq: Every evening | ORAL | Status: DC | PRN

## 2020-02-12 MED ORDER — LABETALOL HCL 200 MG PO TABS: 200.0000 mg | ORAL_TABLET | Freq: Two times a day (BID) | ORAL | Status: DC

## 2020-02-12 MED ORDER — OXYTOCIN 40 UNITS IN NORMAL SALINE INFUSION - SIMPLE MED: 2.5000 [IU]/h | INTRAVENOUS | Status: DC

## 2020-02-12 MED ORDER — TETANUS-DIPHTH-ACELL PERTUSSIS 5-2.5-18.5 LF-MCG/0.5 IM SUSP: 0.5000 mL | Freq: Once | INTRAMUSCULAR | Status: DC

## 2020-02-12 MED ORDER — LIDOCAINE HCL (PF) 1 % IJ SOLN
INTRAMUSCULAR | Status: DC | PRN
  Administered 2020-02-12: 10 mL via EPIDURAL
  Administered 2020-02-12: 2 mL via EPIDURAL

## 2020-02-12 MED ORDER — LACTATED RINGERS IV SOLN
500.0000 mL | Freq: Once | INTRAVENOUS | Status: AC
  Administered 2020-02-12: 500 mL via INTRAVENOUS

## 2020-02-12 MED ORDER — ONDANSETRON HCL 4 MG/2ML IJ SOLN: 4.0000 mg | INTRAMUSCULAR | Status: DC | PRN

## 2020-02-12 MED ORDER — DIPHENHYDRAMINE HCL 50 MG/ML IJ SOLN: 12.5000 mg | INTRAMUSCULAR | Status: DC | PRN

## 2020-02-12 MED ORDER — PHENYLEPHRINE 40 MCG/ML (10ML) SYRINGE FOR IV PUSH (FOR BLOOD PRESSURE SUPPORT): 80.0000 ug | PREFILLED_SYRINGE | INTRAVENOUS | Status: DC | PRN

## 2020-02-12 MED ORDER — SOD CITRATE-CITRIC ACID 500-334 MG/5ML PO SOLN: 30.0000 mL | ORAL | Status: DC | PRN

## 2020-02-12 MED ORDER — LIDOCAINE HCL (PF) 1 % IJ SOLN
30.0000 mL | INTRAMUSCULAR | Status: DC | PRN
  Filled 2020-02-12: qty 30

## 2020-02-12 MED ORDER — MEDROXYPROGESTERONE ACETATE 150 MG/ML IM SUSP: 150.0000 mg | INTRAMUSCULAR | Status: DC | PRN

## 2020-02-12 MED ORDER — FENTANYL-BUPIVACAINE-NACL 0.5-0.125-0.9 MG/250ML-% EP SOLN: 12.0000 mL/h | EPIDURAL | Status: DC | PRN

## 2020-02-12 MED ORDER — SODIUM CHLORIDE (PF) 0.9 % IJ SOLN
INTRAMUSCULAR | Status: DC | PRN
  Administered 2020-02-12: 12 mL/h via EPIDURAL

## 2020-02-12 MED ORDER — PRENATAL MULTIVITAMIN CH
1.0000 | ORAL_TABLET | Freq: Every day | ORAL | Status: DC
  Administered 2020-02-13 – 2020-02-14 (×2): 1 via ORAL
  Filled 2020-02-12 (×2): qty 1

## 2020-02-12 MED ORDER — MEASLES, MUMPS & RUBELLA VAC IJ SOLR: 0.5000 mL | Freq: Once | INTRAMUSCULAR | Status: DC

## 2020-02-12 NOTE — Anesthesia Preprocedure Evaluation (Addendum)
Anesthesia Evaluation  Patient identified by MRN, date of birth, ID band Patient awake    Reviewed: Allergy & Precautions, Patient's Chart, lab work & pertinent test results  Airway Mallampati: II  TM Distance: >3 FB Neck ROM: Full    Dental no notable dental hx.    Pulmonary asthma ,    Pulmonary exam normal breath sounds clear to auscultation       Cardiovascular Normal cardiovascular exam Rhythm:Regular Rate:Normal  POTS- being monitored on telemetry, on labetalol at home   Neuro/Psych  Headaches, PSYCHIATRIC DISORDERS Anxiety    GI/Hepatic negative GI ROS, Neg liver ROS,   Endo/Other  negative endocrine ROS  Renal/GU negative Renal ROS  negative genitourinary   Musculoskeletal Significant thoracic scoliosis, normal lumbar region   Abdominal   Peds negative pediatric ROS (+)  Hematology  (+) Blood dyscrasia, anemia , hct 31.5, plt 292   Anesthesia Other Findings   Reproductive/Obstetrics (+) Pregnancy                            Anesthesia Physical Anesthesia Plan  ASA: II and emergent  Anesthesia Plan: Epidural   Post-op Pain Management:    Induction:   PONV Risk Score and Plan: 2  Airway Management Planned: Natural Airway  Additional Equipment: None  Intra-op Plan:   Post-operative Plan:   Informed Consent: I have reviewed the patients History and Physical, chart, labs and discussed the procedure including the risks, benefits and alternatives for the proposed anesthesia with the patient or authorized representative who has indicated his/her understanding and acceptance.       Plan Discussed with:   Anesthesia Plan Comments:         Anesthesia Quick Evaluation

## 2020-02-12 NOTE — Anesthesia Procedure Notes (Signed)
Epidural Patient location during procedure: OB Start time: 02/12/2020 12:16 PM End time: 02/12/2020 12:29 PM  Staffing Anesthesiologist: Lannie Fields, DO Performed: anesthesiologist   Preanesthetic Checklist Completed: patient identified, IV checked, risks and benefits discussed, monitors and equipment checked, pre-op evaluation and timeout performed  Epidural Patient position: sitting Prep: DuraPrep and site prepped and draped Patient monitoring: continuous pulse ox, blood pressure, heart rate and cardiac monitor Approach: midline Location: L3-L4 Injection technique: LOR air  Needle:  Needle type: Tuohy  Needle gauge: 17 G Needle length: 9 cm Needle insertion depth: 6 cm Catheter type: closed end flexible Catheter size: 19 Gauge Catheter at skin depth: 11 cm Test dose: negative  Assessment Sensory level: T8 Events: blood not aspirated, injection not painful, no injection resistance, no paresthesia and negative IV test  Additional Notes Patient identified. Risks/Benefits/Options discussed with patient including but not limited to bleeding, infection, nerve damage, paralysis, failed block, incomplete pain control, headache, blood pressure changes, nausea, vomiting, reactions to medication both or allergic, itching and postpartum back pain. Confirmed with bedside nurse the patient's most recent platelet count. Confirmed with patient that they are not currently taking any anticoagulation, have any bleeding history or any family history of bleeding disorders. Patient expressed understanding and wished to proceed. All questions were answered. Sterile technique was used throughout the entire procedure. Please see nursing notes for vital signs. Test dose was given through epidural catheter and negative prior to continuing to dose epidural or start infusion. Warning signs of high block given to the patient including shortness of breath, tingling/numbness in hands, complete motor  block, or any concerning symptoms with instructions to call for help. Patient was given instructions on fall risk and not to get out of bed. All questions and concerns addressed with instructions to call with any issues or inadequate analgesia.  Reason for block:procedure for pain

## 2020-02-12 NOTE — H&P (Signed)
Stephanie Hudson is a 29 y.o. female presenting for IOL due to favorable cx and pt request.  GBS neg. OB History    Gravida  1   Para  0   Term  0   Preterm  0   AB  0   Living  0     SAB  0   TAB  0   Ectopic  0   Multiple  0   Live Births             Past Medical History:  Diagnosis Date  . Anemia   . Anxiety   . Asthma   . Chronic headaches   . Concussion May 2002  . Dysmenorrhea   . Dyspnea   . Fatigue   . Headache(784.0)   . History of chicken pox   . Hx of menorrhagia   . Low iron   . Palpitations   . POTS (postural orthostatic tachycardia syndrome)   . Scoliosis    Past Surgical History:  Procedure Laterality Date  . TONSILLECTOMY AND ADENOIDECTOMY  April 20, 2004  . tubes     In ears as baby  . WISDOM TOOTH EXTRACTION  May 24, 2011   Family History: family history includes Cancer - Other in her maternal grandfather; Hearing loss in her paternal grandmother; Stroke in her paternal grandfather. Social History:  reports that she has never smoked. She has never used smokeless tobacco. She reports that she does not drink alcohol or use drugs.     Maternal Diabetes: No Genetic Screening: Normal Maternal Ultrasounds/Referrals: Normal Fetal Ultrasounds or other Referrals:  None Maternal Substance Abuse:  No Significant Maternal Medications:  None Significant Maternal Lab Results:  Group B Strep negative Other Comments:  None  Review of Systems History Dilation: 3 Effacement (%): 70 Station: -2 Exam by:: Zana Biancardi Blood pressure 107/70, pulse 100, temperature 97.6 F (36.4 C). Exam Physical Exam  Prenatal labs: ABO, Rh: O/Positive/-- (09/04 0000) Antibody: Negative (09/04 0000) Rubella: Immune (09/04 0000) RPR: Nonreactive (09/04 0000)  HBsAg: Negative (09/04 0000)  HIV: Non-reactive (09/04 0000)  GBS: Negative/-- (03/09 0000)   Assessment/Plan: IUP at term AROM and pitocin for IOL Anticipate SVD   Turner Daniels 02/12/2020, 9:17  AM

## 2020-02-13 LAB — CBC
HCT: 25.4 % — ABNORMAL LOW (ref 36.0–46.0)
Hemoglobin: 7.9 g/dL — ABNORMAL LOW (ref 12.0–15.0)
MCH: 29.3 pg (ref 26.0–34.0)
MCHC: 31.1 g/dL (ref 30.0–36.0)
MCV: 94.1 fL (ref 80.0–100.0)
Platelets: 257 10*3/uL (ref 150–400)
RBC: 2.7 MIL/uL — ABNORMAL LOW (ref 3.87–5.11)
RDW: 12.9 % (ref 11.5–15.5)
WBC: 14.1 10*3/uL — ABNORMAL HIGH (ref 4.0–10.5)
nRBC: 0 % (ref 0.0–0.2)

## 2020-02-13 MED ORDER — SODIUM CHLORIDE 0.9 % IV SOLN
510.0000 mg | Freq: Once | INTRAVENOUS | Status: AC
  Administered 2020-02-13: 510 mg via INTRAVENOUS
  Filled 2020-02-13: qty 17

## 2020-02-13 NOTE — Lactation Note (Signed)
This note was copied from a baby's chart. Lactation Consultation Note  Patient Name: Stephanie Hudson Date: 02/13/2020   Infant is 15 hrs old. Mom is a P1 who reports + breast changes w/pregnancy. She has a compression stripe on her L nipple (a size 20 nipple shield had been used to help baby latch). The  surface of her R nipple & the base of R nipple (superior portion) are slightly abraded. "Stephanie Hudson" is sleeping & is not showing interest in feeding at this time. Mom was educated about initial (sleepy) newborn behavior.  I will return.  Mom is noted to be on: labetalol, Zoloft, & midodrine (L3).    Lurline Hare [facility] 02/13/2020, 11:38 AM

## 2020-02-13 NOTE — Progress Notes (Signed)
Stephanie Hudson was referred for history of anxiety. * Referral screened out by Clinical Social Worker because none of the following criteria appear to apply: ~ History of anxiety/depression during this pregnancy, or of post-partum depression following prior delivery. ~ Diagnosis of anxiety and/or depression within last 3 years OR * Stephanie Hudson's symptoms currently being treated with medication and/or therapy. Zoloft 100 mg.  Please contact the Clinical Social Worker if needs arise, by [facility] request, or if Stephanie Hudson scores greater than 9/yes to question 10 on Edinburgh Postpartum Depression Screen.  Charletha Dalpe D. Dortha Kern, MSW, [facility] Clinical Social Worker [phone]

## 2020-02-13 NOTE — Progress Notes (Signed)
Post Partum Day 1 Subjective: up ad lib, voiding and tolerating PO  Objective: Blood pressure 119/67, pulse 85, temperature 98.3 F (36.8 C), resp. rate 18, SpO2 98 %, unknown if currently breastfeeding.  Physical Exam:  General: alert, cooperative, appears stated age and mild distress Lochia: appropriate Uterine Fundus: firm Incision: healing well DVT Evaluation: No evidence of DVT seen on physical exam.  Recent Labs    02/12/20 0840 02/13/20 0424  HGB 9.7* 7.9*  HCT 31.5* 25.4*    Assessment/Plan: Plan for discharge tomorrow and Breastfeeding  Anemia with hx of oral intolerance.  Also on labetalol and midodrine due to SVT.  Low BPs so will hold the labetalol for now.  Discussed and will give IV iron due to anemia and hx of oral intolerance   LOS: 1 day   Stephanie Hudson 02/13/2020, 10:14 AM

## 2020-02-13 NOTE — Lactation Note (Signed)
This note was copied from a baby's chart. Lactation Consultation Note  Patient Name: Stephanie Hudson [id number]'[id number] Date: 02/13/2020 Reason for consult: Initial assessment;1st time breastfeeding;Term P1, 6 hour term female infant. Infant had 3 stools and one void since birth. Per mom, she does have DEBP at home. Mom is working on latching infant at breast,  Per mom, infant is improving with latch now that she is little older and RN assisted with latch at last feeding and infant breastfed for 20 minutes, mom only felt a tug with recent latch and  no pain at breast. Mom has slight redness on right breast from previous latch where infant was on tip of nipple, RN will give mom coconut oil to help with breast soreness and mom knows to use EBM on breast as well. LC entered room, RN had recently finish giving infant 8 mls of EBM with a spoon. LC did not observe latch at this time, infant placed in basinet after receiving EBM. Mom knows to breastfeed infant according to feeding cues, 8 to 12 times within 24 hours and on demand, mom knows not to exceed past 3 hours without breastfeeding infant. Mom knows to ask RN or LC for assistance with latching infant at breast if needed. Parents will do as much STS with infant as possible. Mom made aware of O/P services, breastfeeding support groups, community resources, and our phone # for post-discharge questions.    Maternal Data Formula Feeding for Exclusion: No Has patient been taught Hand Expression?: Yes Does the patient have breastfeeding experience prior to this delivery?: No  Feeding Feeding Type: Breast Fed  LATCH Score                   Interventions Interventions: Breast feeding basics reviewed;Skin to skin;Hand express;Expressed milk  Lactation Tools Discussed/Used WIC Program: No   Consult Status Consult Status: Follow-up Date: 02/13/20 Follow-up type: In-patient    Danelle Earthly 02/13/2020, 3:33 AM

## 2020-02-13 NOTE — Lactation Note (Signed)
This note was copied from a baby's chart. Lactation Consultation Note  Patient Name: Stephanie Hudson [id number]'C Date: 02/13/2020   1275-1700: I taught a different hand expression technique to Mom, which she found to be more comfortable. Dad dipped his clean finger into EBM and fed to infant in an attempt to entice her to feed. Amelia persisted in being sleepy.   1749-4496: Infant began awakening (see note below) and was spoon-fed a small amount. As more tongue movement was noticed, infant was brought to the breast. We attempted with Mom using the football hold on her R breast, but positioning was awkward. Mom was placed in side-lying position & a latch attempt was done. Infant latched on & off a few times before stopping and starting into her Mom's eyes. I let Mom/infant bond.  If infant is not latching, parents will continue to spoon-feed or dip a clean finger into EBM. Mom was provided extra colostrum vials so that she can hand express to continue stimulating her breasts.   Note: A few times during my consults today, I've noticed that Lauris Poag will attempt to regurgitate (especially upon awakening), but no emesis will come up. She handles it well and does not seem distressed.      Lurline Hare [facility] 02/13/2020, 2:33 PM

## 2020-02-13 NOTE — Anesthesia Postprocedure Evaluation (Signed)
Anesthesia Post Note  Patient: [name]  Procedure(s) Performed: AN AD HOC LABOR EPIDURAL     Patient location during evaluation: Mother Baby Anesthesia Type: Epidural Level of consciousness: awake and alert and oriented Pain management: satisfactory to patient Vital Signs Assessment: post-procedure vital signs reviewed and stable Respiratory status: respiratory function stable Cardiovascular status: stable Postop Assessment: no headache, no backache, epidural receding, patient able to bend at knees, no signs of nausea or vomiting and adequate PO intake Anesthetic complications: no    Last Vitals:  Vitals:   02/13/20 0015 02/13/20 0400  BP: 121/66 (!) 111/58  Pulse: 89 77  Resp: 16 16  Temp: 37.1 C 36.9 C  SpO2:      Last Pain:  Vitals:   02/13/20 0510  TempSrc:   PainSc: 0-No pain   Pain Goal:                   Mackenzy Grumbine

## 2020-02-14 NOTE — Lactation Note (Signed)
This note was copied from a baby's chart. Lactation Consultation Note  Patient Name: Stephanie Hudson [id number] Date: 02/14/2020   Mom/baby going home today. I saw Mom in hallway & reminded her that after she goes home & rests to begin pumping whenever infant receives formula. Mom verbalized understanding.    Lurline Hare [facility] 02/14/2020, 12:19 PM

## 2020-02-14 NOTE — Lactation Note (Addendum)
This note was copied from a baby's chart. Lactation Consultation Note  Patient Name: Stephanie Hudson [id number] Date: 02/14/2020   Infant is 66 hrs old. Formula was recently initiated by RN as infant cluster-fed overnight & Mom needed a break. Mom said that at this point, she just wants to ensure that infant is being fed & will then work on making breastfeeding better. She also hopes that being at home will help.  Mom's nipples look better today than yesterday (she has been using Comfort Gels). Her breasts seem as if they may be beginning to fill.   The RN had provided a slow-flow nipple with bottle feedings, but I provided extra slow-flow nipples as parents describe "[name]" as having "guzzled" it. I explained to parents that you want softer swallows with bottle feeding. Parents have Avent bottles at home; I recommended they use the "newborn" level nipple.  Mom was set up with a DEBP overnight & said it hurt. I noticed that she had been using size 27 flanges, but at rest, it appears that her nipples would have been fine with size 24 flanges. I suggested that she try the size 24 flange (or her Spectra's pump equivalent) and move up a size if her nipple does not have room during pumping. Parents know about washing pump parts. Parents were shown how to assemble & use hand pump (single- & double-mode) that was included in pump kit.   I asked Mom to let her pediatrician know about her use of midodrine (L3=no data, but probably compatible). I explained that per my resource Marcello Moores Hale's "Medications & Mother's Milk"), there is no data, but based on the drug's properties, it is anticipated that some of the drug would get into the milk & as such, "some caution" was recommended. I mentioned that parents should look for insomnia & restlessness in infant.  Mom said she understood and also said that if she needed to switch medications, it would be OK. Mom is taking 2.5 mg tid as compared to the typical adult dose  of 10 mg tid.  Parents will be going to [facility]; where they have 2 IBCLCs on staff.   [name] [facility] 02/14/2020, 8:54 AM

## 2020-02-14 NOTE — Discharge Summary (Signed)
Obstetric Discharge Summary Reason for Admission: induction of labor Prenatal Procedures: none Intrapartum Procedures: vacuum Postpartum Procedures: none Complications-Operative and Postpartum: partial third degree perineal laceration , anemia and given IV Feraheme Hemoglobin  Date Value Ref Range Status  02/13/2020 7.9 (L) 12.0 - 15.0 g/dL Final   HCT  Date Value Ref Range Status  02/13/2020 25.4 (L) 36.0 - 46.0 % Final    Physical Exam:  General: alert, cooperative, appears stated age and no distress Lochia: appropriate Uterine Fundus: firm Incision: healing well DVT Evaluation: No evidence of DVT seen on physical exam.  Discharge Diagnoses: Term Pregnancy-delivered  Discharge Information: Date: 02/14/2020 Activity: pelvic rest Diet: routine Medications: PNV and Iron Condition: stable Instructions: refer to practice specific booklet Discharge to: home   Newborn Data: Live born female  Birth Weight: 8 lb 5.3 oz (3780 g) APGAR: 8, 9  Newborn Delivery   Birth date/time: 02/12/2020 19:54:00 Delivery type: Vaginal, Vacuum (Extractor)      Home with mother.  Stephanie Hudson 02/14/2020, 9:55 AM

## 2020-06-20 ENCOUNTER — Encounter: Payer: Self-pay | Admitting: Endocrinology

## 2020-06-20 ENCOUNTER — Other Ambulatory Visit: Payer: Self-pay

## 2020-06-20 LAB — TSH
Free Thyroxine Index: 1.8
T3 Uptake: 27
T4,Free (Direct): 6.5
TSH: 0.092

## 2020-06-24 ENCOUNTER — Other Ambulatory Visit: Payer: Self-pay | Admitting: Family Medicine

## 2020-06-24 DIAGNOSIS — E041 Nontoxic single thyroid nodule: Secondary | ICD-10-CM

## 2020-06-27 ENCOUNTER — Ambulatory Visit
Admission: RE | Admit: 2020-06-27 | Discharge: 2020-06-27 | Disposition: A | Discharge status: Home / Self Care | Payer: Self-pay | Source: Ambulatory Visit | Attending: Family Medicine | Admitting: Family Medicine

## 2020-06-27 ENCOUNTER — Other Ambulatory Visit: Payer: Self-pay | Admitting: Family Medicine

## 2020-06-27 DIAGNOSIS — E041 Nontoxic single thyroid nodule: Secondary | ICD-10-CM

## 2020-07-05 ENCOUNTER — Ambulatory Visit
Admission: RE | Admit: 2020-07-05 | Discharge: 2020-07-05 | Disposition: A | Discharge status: Home / Self Care | Payer: BC Managed Care – PPO | Source: Ambulatory Visit | Attending: Family Medicine | Admitting: Family Medicine

## 2020-07-05 ENCOUNTER — Other Ambulatory Visit (HOSPITAL_COMMUNITY)
Admission: RE | Admit: 2020-07-05 | Discharge: 2020-07-05 | Disposition: A | Discharge status: Home / Self Care | Payer: BC Managed Care – PPO | Source: Ambulatory Visit | Attending: Anesthesiology | Admitting: Anesthesiology

## 2020-07-05 DIAGNOSIS — E041 Nontoxic single thyroid nodule: Secondary | ICD-10-CM

## 2020-07-06 LAB — CYTOLOGY - NON PAP

## 2020-08-01 ENCOUNTER — Ambulatory Visit: Payer: BC Managed Care – PPO | Admitting: Endocrinology

## 2020-08-01 ENCOUNTER — Other Ambulatory Visit (INDEPENDENT_AMBULATORY_CARE_PROVIDER_SITE_OTHER): Payer: BC Managed Care – PPO

## 2020-08-01 ENCOUNTER — Other Ambulatory Visit: Payer: Self-pay

## 2020-08-01 ENCOUNTER — Encounter: Payer: Self-pay | Admitting: Endocrinology

## 2020-08-01 DIAGNOSIS — E061 Subacute thyroiditis: Secondary | ICD-10-CM

## 2020-08-01 DIAGNOSIS — E0590 Thyrotoxicosis, unspecified without thyrotoxic crisis or storm: Secondary | ICD-10-CM

## 2020-08-01 LAB — T4, FREE: Free T4: 0.8 ng/dL (ref 0.60–1.60)

## 2020-08-01 LAB — TSH: TSH: 0.81 u[IU]/mL (ref 0.35–4.50)

## 2020-08-01 NOTE — Progress Notes (Signed)
Subjective:    Patient ID: Stephanie Hudson, female    DOB: 1991-09-06, 29 y.o.   MRN: [id number]  HPI Pt is referred by Dr Vincente Poli, for hyperthyroidism.  Pt reports he was dx'ed with hyperthyroidism in 2021.  she has never been on therapy for this.  she has never had XRT to the anterior neck, or thyroid surgery.  she does not consume kelp or any other non-prescribed thyroid medication.  she has never been on amiodarone.  She reports weight gain, heat intolerance, and fatigue.  Pt is 5 mos postpartum.  Past Medical History:  Diagnosis Date  . Anemia   . Anxiety   . Asthma   . Chronic headaches   . Concussion May 2002  . Dysmenorrhea   . Dyspnea   . Fatigue   . Headache(784.0)   . History of chicken pox   . Hx of menorrhagia   . Low iron   . Palpitations   . POTS (postural orthostatic tachycardia syndrome)   . Scoliosis     Past Surgical History:  Procedure Laterality Date  . TONSILLECTOMY AND ADENOIDECTOMY  April 20, 2004  . tubes     In ears as baby  . WISDOM TOOTH EXTRACTION  May 24, 2011    Social History   Socioeconomic History  . Marital status: Married    Spouse name: Not on file  . Number of children: Not on file  . Years of education: 74  . Highest education level: Not on file  Occupational History  . Occupation: Barista    Comment: [employer]  Tobacco Use  . Smoking status: Never Smoker  . Smokeless tobacco: Never Used  Vaping Use  . Vaping Use: Never used  Substance and Sexual Activity  . Alcohol use: No  . Drug use: No  . Sexual activity: Never    Partners: Male    Birth control/protection: Abstinence, Pill  Other Topics Concern  . Not on file  Social History Narrative   Keileigh is graduate of [employer]. 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 She enjoys watching sports, planning her wedding, and cooking. She lives alone.    Social Determinants of Health   Financial Resource Strain:    . Difficulty of Paying Living Expenses: Not on file  Food Insecurity:   . Worried About Programme researcher, broadcasting/film/video in the Last Year: Not on file  . Ran Out of Food in the Last Year: Not on file  Transportation Needs:   . Lack of Transportation (Medical): Not on file  . Lack of Transportation (Non-Medical): Not on file  Physical Activity:   . Days of Exercise per Week: Not on file  . Minutes of Exercise per Session: Not on file  Stress:   . Feeling of Stress : Not on file  Social Connections:   . Frequency of Communication with Friends and Family: Not on file  . Frequency of Social Gatherings with Friends and Family: Not on file  . Attends Religious Services: Not on file  . Active Member of Clubs or Organizations: Not on file  . Attends Banker Meetings: Not on file  . Marital Status: Not on file  Intimate Partner Violence:   . Fear of Current or Ex-Partner: Not on file  . Emotionally Abused: Not on file  . Physically Abused: Not on file  . Sexually Abused: Not on file    Current Outpatient Medications on File Prior  to Visit  Medication Sig Dispense Refill  . EPINEPHrine 0.3 mg/0.3 mL IJ SOAJ injection Inject 0.3 mLs (0.3 mg total) into the muscle once. For severe allergic reaction 1 Device 0  . midodrine (PROAMATINE) 2.5 MG tablet Take 2.5 mg by mouth 3 (three) times daily with meals.    . norethindrone (MICRONOR) 0.35 MG tablet Take 1 tablet by mouth daily.    . sertraline (ZOLOFT) 100 MG tablet Take 100 mg by mouth daily.      No current facility-administered medications on file prior to visit.    Allergies  Allergen Reactions  . Latex Anaphylaxis    Family History  Problem Relation Age of Onset  . Cancer - Other Maternal Grandfather        Died at 54-Brain Tumor  . Stroke Paternal Grandfather        Died at 38  . Thyroid disease Sister   . Hearing loss Paternal Grandmother        degenerative hearing loss    BP 102/70   Pulse 84   Ht 5' 7.5" (1.715 m)    Wt 192 lb 12.8 oz (87.5 kg)   SpO2 96%   BMI 29.75 kg/m   Review of Systems denies palpitations, sob, muscle weakness, excessive diaphoresis, and tremor.  Neck pain is resolved.       Objective:   Physical Exam VS: see vs page GEN: no distress HEAD: head: no deformity eyes: no periorbital swelling, no proptosis external nose and ears are normal NECK: 1-2 cm left thyr nodule.  Right lobe is not enlarged, but firm.   CHEST WALL: no deformity LUNGS: clear to auscultation CV: reg rate and rhythm, no murmur.  MUSCULOSKELETAL: muscle bulk and strength are grossly normal.  no obvious joint swelling.  gait is normal and steady EXTEMITIES: no deformity.  no edema PULSES: no carotid bruit NEURO:  cn 2-12 grossly intact.   readily moves all 4's.  sensation is intact to touch on all 4's.  No tremor SKIN:  Normal texture and temperature.  No rash or suspicious lesion is visible.  Not diaphoretic.   NODES:  None palpable at the neck PSYCH: alert, well-oriented.  Does not appear anxious nor depressed.     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  In the postpartum state, lactation had stopped.  She is also on medication for POTS  Lab Results  Component Value Date   TSH 0.81 08/01/2020       Assessment & Plan:  Hyperthyroidism, resolved.  This suggests subacute thyroiditis as the cause.  No medication is needed now. Goiter, prob due to the above.  Recheck [place].  Please come back for a follow-up appointment in 6 weeks.

## 2020-08-01 NOTE — Patient Instructions (Signed)
Blood tests are requested for you today.  We'll let you know about the results.  

## 2020-08-03 DIAGNOSIS — E061 Subacute thyroiditis: Secondary | ICD-10-CM | POA: Insufficient documentation

## 2020-10-11 ENCOUNTER — Other Ambulatory Visit: Payer: Self-pay | Admitting: Internal Medicine

## 2020-10-11 DIAGNOSIS — E041 Nontoxic single thyroid nodule: Secondary | ICD-10-CM

## 2020-10-18 ENCOUNTER — Other Ambulatory Visit (HOSPITAL_COMMUNITY)
Admission: RE | Admit: 2020-10-18 | Discharge: 2020-10-18 | Disposition: A | Discharge status: Home / Self Care | Payer: BC Managed Care – PPO | Source: Ambulatory Visit | Attending: Radiology | Admitting: Radiology

## 2020-10-18 ENCOUNTER — Ambulatory Visit
Admission: RE | Admit: 2020-10-18 | Discharge: 2020-10-18 | Disposition: A | Discharge status: Home / Self Care | Payer: BC Managed Care – PPO | Source: Ambulatory Visit | Attending: Internal Medicine | Admitting: Internal Medicine

## 2020-10-18 DIAGNOSIS — E041 Nontoxic single thyroid nodule: Secondary | ICD-10-CM

## 2020-10-20 LAB — CYTOLOGY - NON PAP

## 2020-11-21 ENCOUNTER — Other Ambulatory Visit: Payer: Self-pay | Admitting: Otolaryngology

## 2020-11-21 DIAGNOSIS — R221 Localized swelling, mass and lump, neck: Secondary | ICD-10-CM

## 2020-12-06 ENCOUNTER — Other Ambulatory Visit: Payer: BC Managed Care – PPO

## 2020-12-19 ENCOUNTER — Ambulatory Visit
Admission: RE | Admit: 2020-12-19 | Discharge: 2020-12-19 | Disposition: A | Discharge status: Home / Self Care | Payer: BC Managed Care – PPO | Source: Ambulatory Visit | Attending: Otolaryngology | Admitting: Otolaryngology

## 2020-12-19 DIAGNOSIS — R221 Localized swelling, mass and lump, neck: Secondary | ICD-10-CM

## 2020-12-19 MED ORDER — IOPAMIDOL (ISOVUE-300) INJECTION 61%
75.0000 mL | Freq: Once | INTRAVENOUS | Status: AC | PRN
  Administered 2020-12-19: 75 mL via INTRAVENOUS

## 2021-09-20 ENCOUNTER — Other Ambulatory Visit: Payer: Self-pay | Admitting: Otolaryngology

## 2021-09-20 DIAGNOSIS — R1313 Dysphagia, pharyngeal phase: Secondary | ICD-10-CM

## 2021-10-13 ENCOUNTER — Ambulatory Visit
Admission: RE | Admit: 2021-10-13 | Discharge: 2021-10-13 | Disposition: A | Discharge status: Home / Self Care | Payer: BC Managed Care – PPO | Source: Ambulatory Visit | Attending: Otolaryngology | Admitting: Otolaryngology

## 2021-10-13 ENCOUNTER — Encounter: Payer: Self-pay | Admitting: Radiology

## 2021-10-13 DIAGNOSIS — K219 Gastro-esophageal reflux disease without esophagitis: Secondary | ICD-10-CM | POA: Diagnosis not present

## 2021-10-13 DIAGNOSIS — R1313 Dysphagia, pharyngeal phase: Secondary | ICD-10-CM | POA: Diagnosis not present

## 2021-12-23 DIAGNOSIS — Z20822 Contact with and (suspected) exposure to covid-19: Secondary | ICD-10-CM | POA: Diagnosis not present

## 2021-12-23 DIAGNOSIS — R112 Nausea with vomiting, unspecified: Secondary | ICD-10-CM | POA: Diagnosis not present

## 2021-12-23 DIAGNOSIS — Z3202 Encounter for pregnancy test, result negative: Secondary | ICD-10-CM | POA: Diagnosis not present

## 2022-01-09 DIAGNOSIS — Z79899 Other long term (current) drug therapy: Secondary | ICD-10-CM | POA: Diagnosis not present

## 2022-01-09 DIAGNOSIS — R1084 Generalized abdominal pain: Secondary | ICD-10-CM | POA: Diagnosis not present

## 2022-01-09 DIAGNOSIS — R112 Nausea with vomiting, unspecified: Secondary | ICD-10-CM | POA: Diagnosis not present

## 2022-01-09 DIAGNOSIS — J45909 Unspecified asthma, uncomplicated: Secondary | ICD-10-CM | POA: Diagnosis not present

## 2022-01-09 DIAGNOSIS — E860 Dehydration: Secondary | ICD-10-CM | POA: Diagnosis not present

## 2022-01-09 DIAGNOSIS — R197 Diarrhea, unspecified: Secondary | ICD-10-CM | POA: Diagnosis not present

## 2022-01-09 DIAGNOSIS — R109 Unspecified abdominal pain: Secondary | ICD-10-CM | POA: Diagnosis not present

## 2022-01-10 DIAGNOSIS — R109 Unspecified abdominal pain: Secondary | ICD-10-CM | POA: Diagnosis not present

## 2022-01-18 ENCOUNTER — Encounter: Payer: Self-pay | Admitting: Internal Medicine

## 2022-01-18 ENCOUNTER — Ambulatory Visit: Payer: BC Managed Care – PPO | Admitting: Internal Medicine

## 2022-01-18 ENCOUNTER — Other Ambulatory Visit: Payer: Self-pay

## 2022-01-18 VITALS — BP 98/68 | HR 78 | Ht 68.0 in | Wt 159.0 lb

## 2022-01-18 DIAGNOSIS — G901 Familial dysautonomia [Riley-Day]: Secondary | ICD-10-CM

## 2022-01-18 DIAGNOSIS — R002 Palpitations: Secondary | ICD-10-CM | POA: Diagnosis not present

## 2022-01-18 MED ORDER — MIDODRINE HCL 2.5 MG PO TABS: 2.5000 mg | ORAL_TABLET | Freq: Three times a day (TID) | ORAL | 3 refills | Status: DC

## 2022-01-18 NOTE — Patient Instructions (Addendum)
Medication Instructions:  ?Your physician recommends that you continue on your current medications as directed. Please refer to the Current Medication list given to you today. ? ?*If you need a refill on your cardiac medications before your next appointment, please call your pharmacy* ? ? ?Lab Work: ?CBC an CMET today ? ?If you have labs (blood work) drawn today and your tests are completely normal, you will receive your results only by: ?MyChart Message (if you have MyChart) OR ?A paper copy in the mail ?If you have any lab test that is abnormal or we need to change your treatment, we will call you to review the results. ? ? ?Testing/Procedures: ?None ordered. ? ? ? ?Follow-Up: ?At [facility], you and your health needs are our priority.  As part of our continuing mission to provide you with exceptional heart care, we have created designated Provider Care Teams.  These Care Teams include your primary Cardiologist (physician) and Advanced Practice Providers (APPs -  Physician Assistants and Nurse Practitioners) who all work together to provide you with the care you need, when you need it. ? ?We recommend signing up for the patient portal called "MyChart".  Sign up information is provided on this After Visit Summary.  MyChart is used to connect with patients for Virtual Visits (Telemedicine).  Patients are able to view lab/test results, encounter notes, upcoming appointments, etc.  Non-urgent messages can be sent to your provider as well.   ?To learn more about what you can do with MyChart, go to [email or web address].   ? ?Your next appointment:   ?24 months with Dr Caryl Comes ?

## 2022-01-18 NOTE — Progress Notes (Signed)
Patient Care Team: ?Merri Brunette, MD as PCP - General (Family Medicine) ?Deetta Perla, MD (Inactive) as Consulting Physician (Pediatrics) ? ? ?HPI ? ?Amorie Rentz is a 31 y.o. female ?Seen in followup for palpitations and exercise intolerance. She  has an elevated blood pressure.  She was being treated with volume repletion and salt with some improvement  She had been much improved.  ?   ?Pecola Leisure is now 31 years old.  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  She has had some dizziness which has been responsive to ProAmatine.  She has been able to exercise although with aerobic exercise she finds tachy palpitations.  She has lost about 40 pounds. ? ?Few weeks ago she developed a GI illness characterized by nausea vomiting and diarrhea, profoundly lightheaded with it.  Abated and then resolved.  Was seen is some urgent care, was told her bilirubin was "markedly elevated "that she would need to have her gallbladder taken out, that her gallbladder ultrasound was normal.  Bilirubin was also 1.9. ? ?Her dizziness is better but not back at baseline. ? ?  ? ?Past Surgical History:  ?Procedure Laterality Date  ? TONSILLECTOMY AND ADENOIDECTOMY  April 20, 2004  ? tubes    ? In ears as baby  ? WISDOM TOOTH EXTRACTION  May 24, 2011  ? ? ?Current Outpatient Medications  ?Medication Sig Dispense Refill  ? EPINEPHrine 0.3 mg/0.3 mL IJ SOAJ injection Inject 0.3 mLs (0.3 mg total) into the muscle once. For severe allergic reaction 1 Device 0  ? midodrine (PROAMATINE) 2.5 MG tablet Take 2.5 mg by mouth 3 (three) times daily with meals.    ? NIKKI 3-0.02 MG tablet Take 1 tablet by mouth daily.    ? sertraline (ZOLOFT) 100 MG tablet Take 100 mg by mouth daily.     ? norethindrone (MICRONOR) 0.35 MG tablet Take 1 tablet by mouth daily. (Patient not taking: Reported on 01/18/2022)    ? ?No current facility-administered medications for this visit.   ?AE ? ?Allergies  ?Allergen Reactions  ? Latex Anaphylaxis  ? ? ?Review of Systems negative except from HPI and PMH ? ?Physical Exam ?BP 98/68   Pulse 78   Ht 5\' 8"  (1.727 m)   Wt 159 lb (72.1 kg)   SpO2 100%   BMI 24.18 kg/m?  ?Well developed and nourished in no acute distress ?HENT normal ?Neck supple with JVP-  flat   ?Clear ?Regular rate and rhythm, no murmurs or gallops ?Abd-soft with active BS ?No Clubbing cyanosis edema ?Skin-warm and dry ?A & Oriented  Grossly normal sensory and motor function ? ?ECG    ?  ? ?Assessment and  Plan ? ?Postural orthostatic tachycardia Dysautonomia ?  ?Syncope ? ?Pregnancy ? ?Swelling  ? ? is now 31 years old. ? ?Dysautonomia has been largely quiescent until a recent intervening gastrointestinal illness that resolved and then repeated.  Nausea vomiting diarrhea and actually syncope with dehydration and tachycardia.  [facility]  Bilirubin was mildly elevated.  We will recheck it today.  Hemoglobin was surprisingly high suspect hemoconcentration. ? ?We will renew her ProAmatine. ? ? ?

## 2022-01-20 LAB — CBC
Hematocrit: 39.3 % (ref 34.0–46.6)
Hemoglobin: 13.4 g/dL (ref 11.1–15.9)
MCH: 31.1 pg (ref 26.6–33.0)
MCHC: 34.1 g/dL (ref 31.5–35.7)
MCV: 91 fL (ref 79–97)
Platelets: 380 10*3/uL (ref 150–450)
RBC: 4.31 x10E6/uL (ref 3.77–5.28)
RDW: 12.3 % (ref 11.7–15.4)
WBC: 9.5 10*3/uL (ref 3.4–10.8)

## 2022-01-20 LAB — COMPREHENSIVE METABOLIC PANEL
ALT: 25 IU/L (ref 0–32)
AST: 18 IU/L (ref 0–40)
Albumin/Globulin Ratio: 1.9 (ref 1.2–2.2)
Albumin: 4.7 g/dL (ref 3.9–5.0)
Alkaline Phosphatase: 92 IU/L (ref 44–121)
BUN/Creatinine Ratio: 17 (ref 9–23)
BUN: 14 mg/dL (ref 6–20)
Bilirubin Total: 0.6 mg/dL (ref 0.0–1.2)
CO2: 21 mmol/L (ref 20–29)
Calcium: 9.6 mg/dL (ref 8.7–10.2)
Chloride: 104 mmol/L (ref 96–106)
Creatinine, Ser: 0.81 mg/dL (ref 0.57–1.00)
Globulin, Total: 2.5 g/dL (ref 1.5–4.5)
Glucose: 88 mg/dL (ref 70–99)
Potassium: 4.5 mmol/L (ref 3.5–5.2)
Sodium: 142 mmol/L (ref 134–144)
Total Protein: 7.2 g/dL (ref 6.0–8.5)
eGFR: 101 mL/min/{1.73_m2} (ref >59)

## 2022-07-05 LAB — OB RESULTS CONSOLE RPR: RPR: NONREACTIVE

## 2022-07-05 LAB — OB RESULTS CONSOLE HIV ANTIBODY (ROUTINE TESTING): HIV: NONREACTIVE

## 2022-07-05 LAB — OB RESULTS CONSOLE RUBELLA ANTIBODY, IGM: Rubella: IMMUNE

## 2022-07-05 LAB — OB RESULTS CONSOLE ABO/RH: RH Type: POSITIVE

## 2022-07-05 LAB — HEPATITIS C ANTIBODY: HCV Ab: NEGATIVE

## 2022-07-05 LAB — OB RESULTS CONSOLE HEPATITIS B SURFACE ANTIGEN: Hepatitis B Surface Ag: NEGATIVE

## 2022-07-05 LAB — OB RESULTS CONSOLE ANTIBODY SCREEN: Antibody Screen: NEGATIVE

## 2022-09-04 LAB — OB RESULTS CONSOLE GC/CHLAMYDIA
Chlamydia: NEGATIVE
Neisseria Gonorrhea: NEGATIVE

## 2022-10-09 IMAGING — RF DG ESOPHAGUS
9 series · 14 of 24 positions shown · non-contrast
Comparison: CT neck 12/19/2020

CLINICAL DATA: Pharyngeal dysphagia. History of thyroid nodule
status post biopsy.

EXAM:
ESOPHOGRAM / BARIUM SWALLOW / BARIUM TABLET STUDY
TECHNIQUE: Combined double contrast and single contrast examination performed
using effervescent crystals, thick barium liquid, and thin barium
liquid. The patient was observed with fluoroscopy swallowing a 13 mm
barium sulphate tablet.
FLUOROSCOPY TIME:  Fluoroscopy Time: 1 minute and 6 seconds of
low-dose pulsed fluoroscopy
Radiation Exposure Index (if provided by the fluoroscopic device):
4.3 mGy
Number of Acquired Spot Images: 0

[Series 1: sequence · 2 of 14 frames shown (1 of 6)]
[frame 3/14]
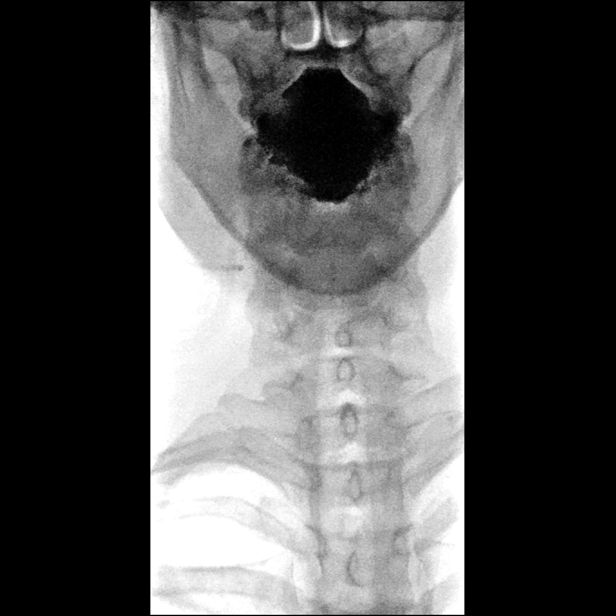
[frame 12/14]
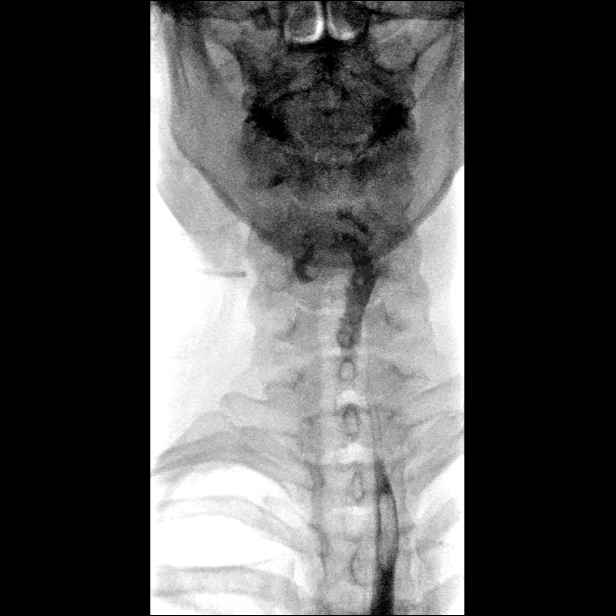

[Series 2: sequence · 1 of 26 frames shown (2 of 6)]
[frame 4/26]
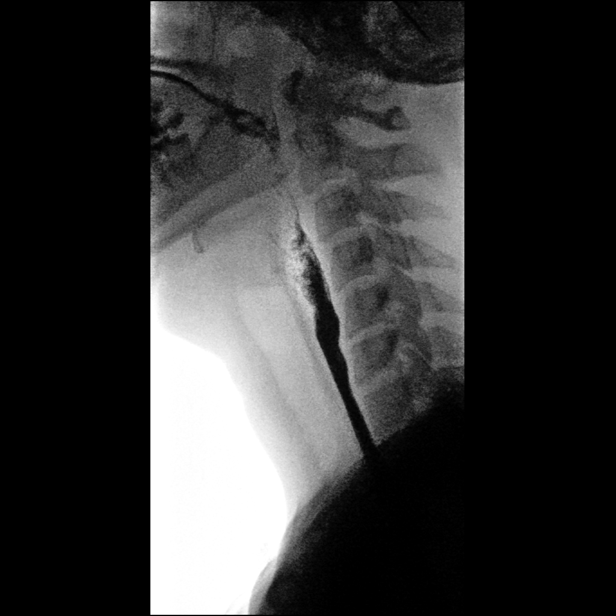

[Series 3: sequence · 2 of 29 frames shown (3 of 6)]
[frame 5/29]
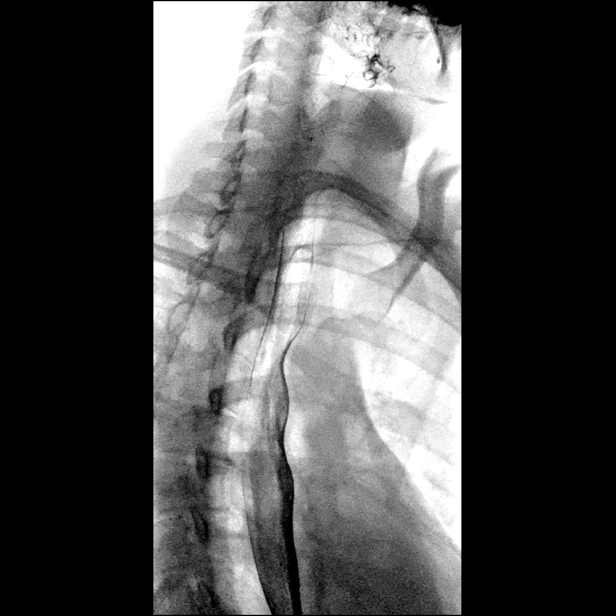
[frame 15/29]
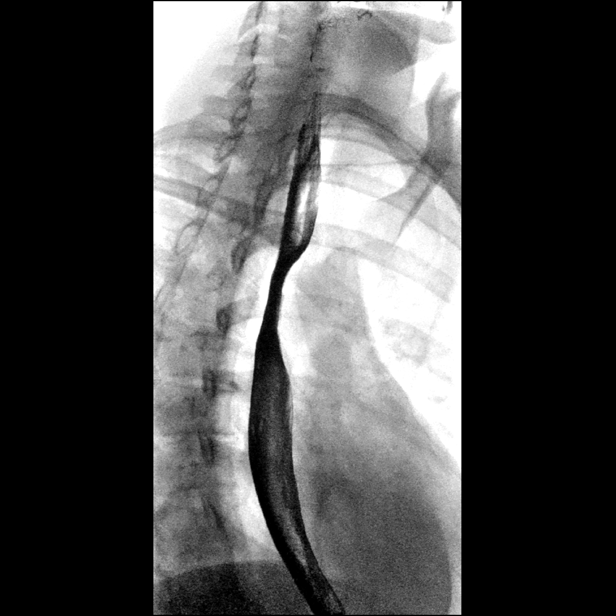

[Series 4: one shot · 2 of 4 slices shown (1 of 3)]
[im 1/4]
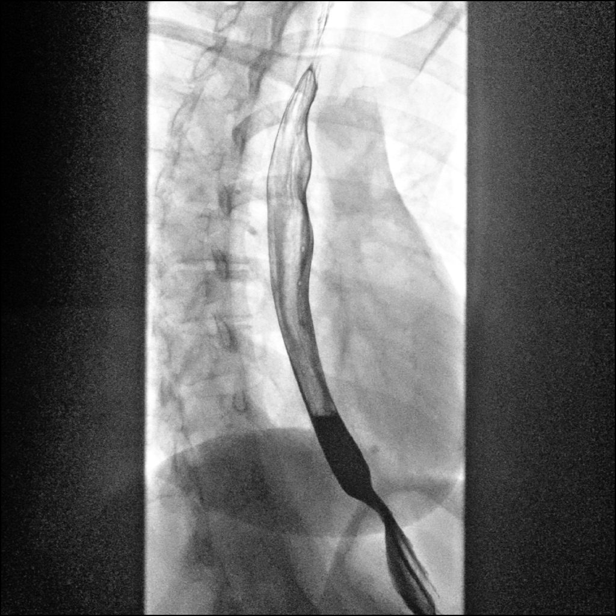
[im 3/4]
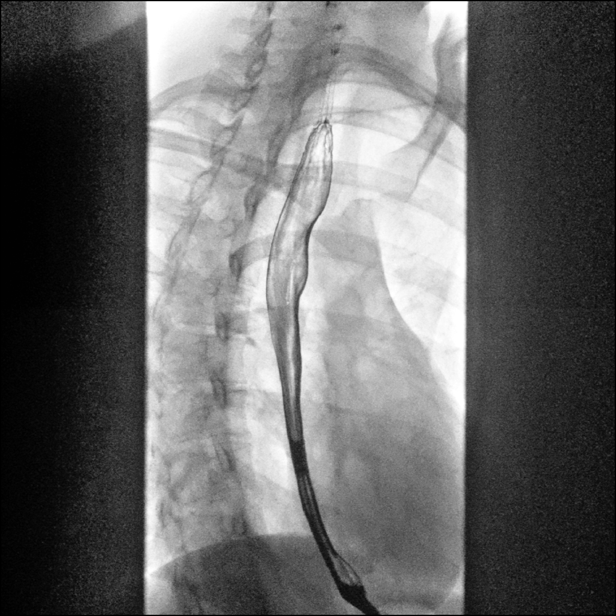

[Series 5: sequence · 2 of 8 frames shown (4 of 6)]
[frame 2/8]
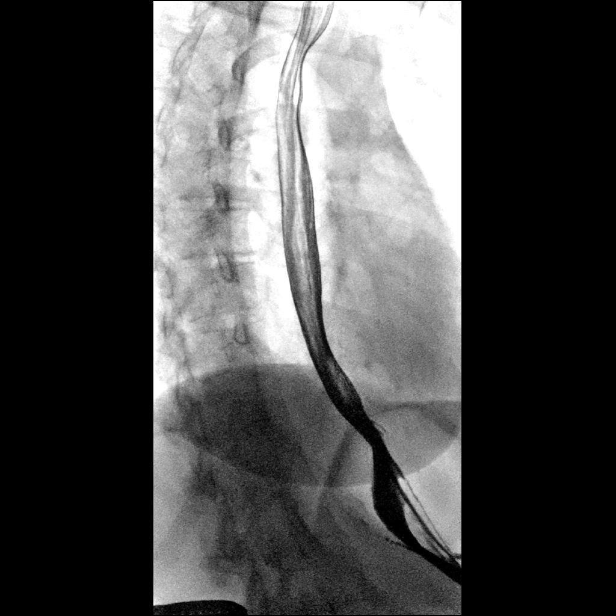
[frame 7/8]
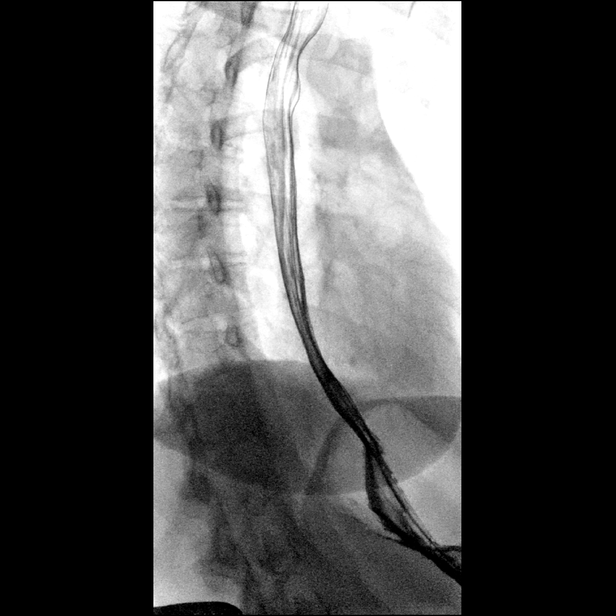

[Series 6: sequence · 1 of 28 frames shown (5 of 6)]
[frame 24/28]
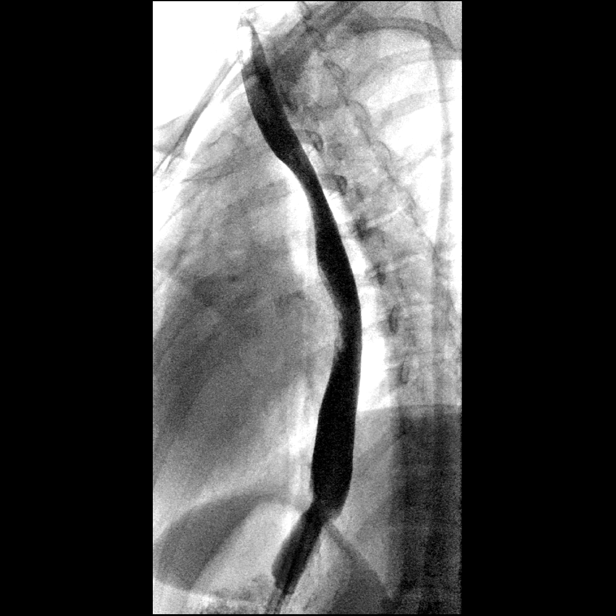

[Series 7: one shot · 2 of 3 slices shown (2 of 3)]
[im 1/3]
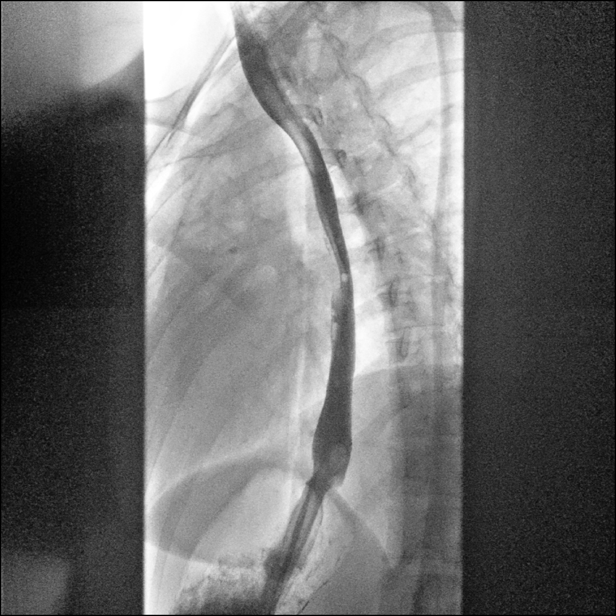
[im 3/3]
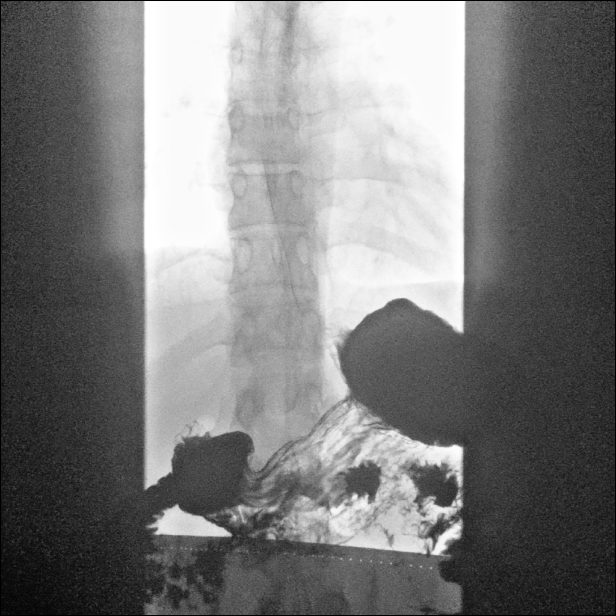

[Series 8: sequence · 1 of 13 frames shown (6 of 6)]
[frame 7/13]
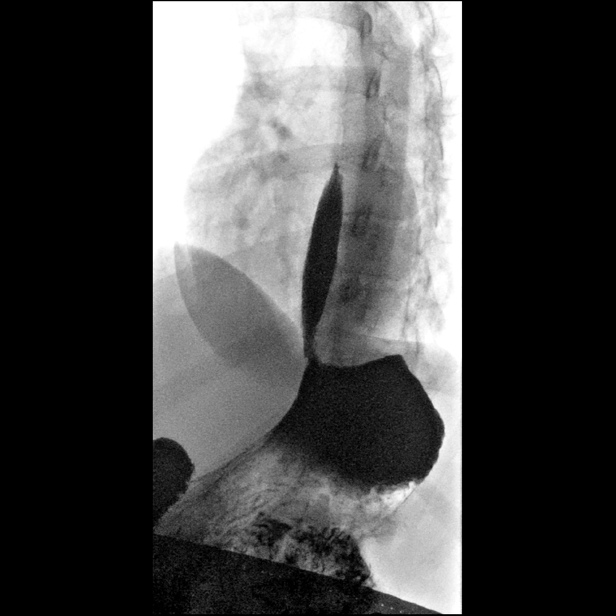

[Series 9: one shot · 1 of 1 slices shown (3 of 3)]
[im 1/1]
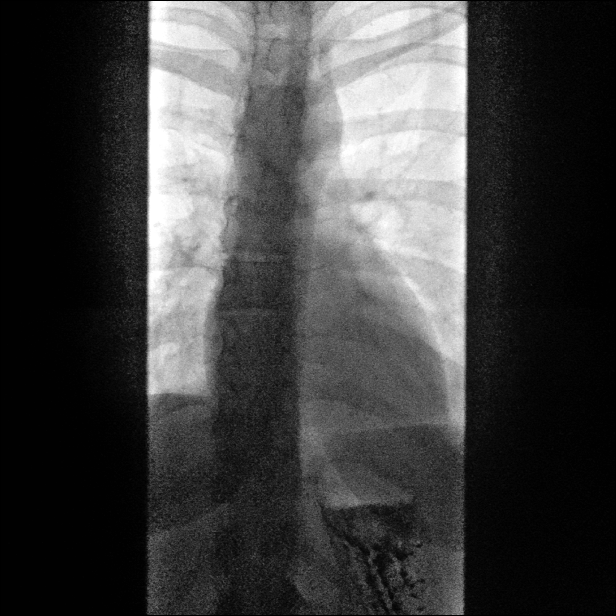

[14 of 24 positions shown; findings below may reference images not displayed]

FINDINGS: The patient swallowed the barium without difficulty. Rapid sequence
imaging of the pharynx in the AP and lateral projections
demonstrates no laryngeal penetration or mucosal abnormalities.

The esophageal motility is normal. There is no evidence of
esophageal mass, stricture or ulceration. Mild gastroesophageal
reflux was elicited with the water siphon test. No hiatal hernia.

At the conclusion of the study, a 13 mm barium tablet was
administered. This passed without delay into the stomach.
IMPRESSION: 1. Mild gastroesophageal reflux.
2. Otherwise normal esophagram.

## 2023-01-17 LAB — OB RESULTS CONSOLE GBS: GBS: NEGATIVE

## 2023-01-24 ENCOUNTER — Telehealth (HOSPITAL_COMMUNITY): Payer: Self-pay | Admitting: *Deleted

## 2023-01-24 ENCOUNTER — Encounter (HOSPITAL_COMMUNITY): Payer: Self-pay | Admitting: *Deleted

## 2023-01-24 ENCOUNTER — Encounter (HOSPITAL_COMMUNITY): Payer: Self-pay

## 2023-01-24 NOTE — Telephone Encounter (Signed)
Preadmission screen  

## 2023-01-25 ENCOUNTER — Encounter (HOSPITAL_COMMUNITY): Payer: Self-pay

## 2023-01-25 NOTE — Patient Instructions (Signed)
Marbella Watchman  01/25/2023   Your procedure is scheduled on:  02/05/2023  Arrive at [facility] at Entrance C on [address] at [facility]  and [address]. You are invited to use the FREE valet parking or use the Visitor's parking deck.  Pick up the phone at the desk and dial [phone].  Call this number if you have problems the morning of surgery: [phone]  Remember:   Do not eat food:(After Midnight) Desps de medianoche.  Do not drink clear liquids: (After Midnight) Desps de medianoche.  Take these medicines the morning of surgery with A SIP OF WATER:  none   Do not wear jewelry, make-up or nail polish.  Do not wear lotions, powders, or perfumes. Do not wear deodorant.  Do not shave 48 hours prior to surgery.  Do not bring valuables to the hospital.  [facility] is not   responsible for any belongings or valuables brought to the hospital.  Contacts, dentures or bridgework may not be worn into surgery.  Leave suitcase in the car. After surgery it may be brought to your room.  For patients admitted to the hospital, checkout time is 11:00 AM the day of              discharge.      Please read over the following fact sheets that you were given:     Preparing for Surgery

## 2023-01-29 ENCOUNTER — Other Ambulatory Visit: Payer: Self-pay | Admitting: Obstetrics and Gynecology

## 2023-01-29 DIAGNOSIS — E079 Disorder of thyroid, unspecified: Secondary | ICD-10-CM

## 2023-02-04 ENCOUNTER — Telehealth (HOSPITAL_COMMUNITY): Payer: Self-pay | Admitting: *Deleted

## 2023-02-04 ENCOUNTER — Other Ambulatory Visit (HOSPITAL_COMMUNITY)
Admission: RE | Admit: 2023-02-04 | Discharge: 2023-02-04 | Disposition: A | Discharge status: Home / Self Care | Payer: BC Managed Care – PPO | Source: Ambulatory Visit | Attending: Obstetrics and Gynecology | Admitting: Obstetrics and Gynecology

## 2023-02-04 ENCOUNTER — Encounter (HOSPITAL_COMMUNITY): Payer: Self-pay | Admitting: Obstetrics and Gynecology

## 2023-02-04 DIAGNOSIS — Z3A39 39 weeks gestation of pregnancy: Secondary | ICD-10-CM | POA: Insufficient documentation

## 2023-02-04 DIAGNOSIS — Z01812 Encounter for preprocedural laboratory examination: Secondary | ICD-10-CM | POA: Insufficient documentation

## 2023-02-04 DIAGNOSIS — O26893 Other specified pregnancy related conditions, third trimester: Secondary | ICD-10-CM | POA: Insufficient documentation

## 2023-02-04 HISTORY — DX: Unspecified hearing loss, unspecified ear: H91.90

## 2023-02-04 LAB — RPR: RPR Ser Ql: NONREACTIVE

## 2023-02-04 LAB — CBC
HCT: 32.7 % — ABNORMAL LOW (ref 36.0–46.0)
Hemoglobin: 10.2 g/dL — ABNORMAL LOW (ref 12.0–15.0)
MCH: 28.2 pg (ref 26.0–34.0)
MCHC: 31.2 g/dL (ref 30.0–36.0)
MCV: 90.3 fL (ref 80.0–100.0)
Platelets: 213 10*3/uL (ref 150–400)
RBC: 3.62 MIL/uL — ABNORMAL LOW (ref 3.87–5.11)
RDW: 13.4 % (ref 11.5–15.5)
WBC: 7.3 10*3/uL (ref 4.0–10.5)
nRBC: 0 % (ref 0.0–0.2)

## 2023-02-04 LAB — TYPE AND SCREEN
ABO/RH(D): O POS
Antibody Screen: NEGATIVE

## 2023-02-04 NOTE — H&P (Signed)
Stephanie Hudson is a 32 year old G 2 P 1 at 39 weeks presents for Primary LTCS. Previous pregnancy Complicated by 3rd degree tear and patient declines vaginal delivery.  History of POTS under cardiology care and stable  OB History     Gravida  2   Para  1   Term  1   Preterm  0   AB  0   Living  1      SAB  0   IAB  0   Ectopic  0   Multiple  0   Live Births  1          Past Medical History:  Diagnosis Date   Anemia    Anxiety    Asthma    Chronic headaches    Concussion 03/2001   Dysmenorrhea    Dyspnea    Fatigue    Headache(784.0)    Hearing loss    History of chicken pox    Hx of menorrhagia    Low iron    Palpitations    POTS (postural orthostatic tachycardia syndrome)    Scoliosis    Past Surgical History:  Procedure Laterality Date   TONSILLECTOMY AND ADENOIDECTOMY  April 20, 2004   tubes     In ears as baby   WISDOM TOOTH EXTRACTION  May 24, 2011   Family History: family history includes Cancer - Other in her maternal grandfather; Hearing loss in her paternal grandmother; Hypertension in her father and mother; Stroke in her maternal grandmother and paternal grandfather. Social History:  reports that she has never smoked. She has never used smokeless tobacco. She reports that she does not drink alcohol and does not use drugs.     Maternal Diabetes: No Genetic Screening: Normal Maternal Ultrasounds/Referrals: Normal Fetal Ultrasounds or other Referrals:  None Maternal Substance Abuse:  No Significant Maternal Medications:  None Significant Maternal Lab Results:  Group B Strep negative Number of Prenatal Visits:greater than 3 verified prenatal visits Other Comments:  None  Review of Systems  All other systems reviewed and are negative.  History   unknown if currently breastfeeding. Exam Physical Exam Vitals and nursing note reviewed. Exam conducted with a chaperone present.  Constitutional:      Appearance: Normal appearance.   HENT:     Head: Normocephalic.     Nose: Nose normal.     Mouth/Throat:     Mouth: Mucous membranes are moist.  Cardiovascular:     Rate and Rhythm: Normal rate and regular rhythm.  Pulmonary:     Effort: Pulmonary effort is normal.  Abdominal:     General: Abdomen is flat.  Neurological:     Mental Status: She is alert.     Prenatal labs: ABO, Rh: O/Positive/-- (08/24 0000) Antibody: Negative (08/24 0000) Rubella: Immune (08/24 0000) RPR: Nonreactive (08/24 0000)  HBsAg: Negative (08/24 0000)  HIV: Non-reactive (08/24 0000)  GBS: Negative/-- (03/07 0000)   Assessment/Plan: IUP at term Declines vaginal Delivery Proceed with Primary LTCS Discussed with patient  Consent signed   Cyril Mourning 02/04/2023, 9:02 AM

## 2023-02-04 NOTE — Telephone Encounter (Signed)
Preadmission screen  

## 2023-02-05 ENCOUNTER — Encounter (HOSPITAL_COMMUNITY): Payer: Self-pay | Admitting: Obstetrics and Gynecology

## 2023-02-05 ENCOUNTER — Inpatient Hospital Stay (HOSPITAL_COMMUNITY)
Admission: RE | Admit: 2023-02-05 | Discharge: 2023-02-07 | DRG: 787 | Disposition: A | Discharge status: Home / Self Care | Payer: BC Managed Care – PPO | Source: Ambulatory Visit | Attending: Obstetrics and Gynecology | Admitting: Obstetrics and Gynecology

## 2023-02-05 ENCOUNTER — Inpatient Hospital Stay (HOSPITAL_COMMUNITY): Payer: BC Managed Care – PPO | Admitting: Anesthesiology

## 2023-02-05 ENCOUNTER — Other Ambulatory Visit: Payer: Self-pay

## 2023-02-05 ENCOUNTER — Encounter (HOSPITAL_COMMUNITY)
Admission: RE | Disposition: A | Discharge status: Home / Self Care | Payer: Self-pay | Source: Ambulatory Visit | Attending: Obstetrics and Gynecology

## 2023-02-05 DIAGNOSIS — O3663X0 Maternal care for excessive fetal growth, third trimester, not applicable or unspecified: Secondary | ICD-10-CM | POA: Diagnosis present

## 2023-02-05 DIAGNOSIS — Z3A39 39 weeks gestation of pregnancy: Secondary | ICD-10-CM | POA: Diagnosis not present

## 2023-02-05 DIAGNOSIS — O26893 Other specified pregnancy related conditions, third trimester: Secondary | ICD-10-CM | POA: Diagnosis present

## 2023-02-05 DIAGNOSIS — D62 Acute posthemorrhagic anemia: Secondary | ICD-10-CM | POA: Diagnosis not present

## 2023-02-05 DIAGNOSIS — O99892 Other specified diseases and conditions complicating childbirth: Secondary | ICD-10-CM | POA: Diagnosis present

## 2023-02-05 DIAGNOSIS — Z98891 History of uterine scar from previous surgery: Secondary | ICD-10-CM

## 2023-02-05 DIAGNOSIS — Z3490 Encounter for supervision of normal pregnancy, unspecified, unspecified trimester: Secondary | ICD-10-CM

## 2023-02-05 DIAGNOSIS — O9902 Anemia complicating childbirth: Secondary | ICD-10-CM | POA: Diagnosis present

## 2023-02-05 DIAGNOSIS — Z91040 Latex allergy status: Secondary | ICD-10-CM

## 2023-02-05 DIAGNOSIS — H9190 Unspecified hearing loss, unspecified ear: Secondary | ICD-10-CM | POA: Diagnosis present

## 2023-02-05 SURGERY — Surgical Case
Anesthesia: Spinal | Site: Abdomen

## 2023-02-05 MED ORDER — FENTANYL CITRATE (PF) 100 MCG/2ML IJ SOLN
INTRAMUSCULAR | Status: DC | PRN
  Administered 2023-02-05: 15 ug via INTRATHECAL

## 2023-02-05 MED ORDER — ACETAMINOPHEN 325 MG PO TABS
650.0000 mg | ORAL_TABLET | ORAL | Status: DC | PRN
  Administered 2023-02-06 – 2023-02-07 (×4): 650 mg via ORAL
  Filled 2023-02-05 (×4): qty 2

## 2023-02-05 MED ORDER — EPHEDRINE SULFATE-NACL 50-0.9 MG/10ML-% IV SOSY
PREFILLED_SYRINGE | INTRAVENOUS | Status: DC | PRN
  Administered 2023-02-05: 5 mg via INTRAVENOUS
  Administered 2023-02-05: 10 mg via INTRAVENOUS

## 2023-02-05 MED ORDER — PHENYLEPHRINE HCL-NACL 20-0.9 MG/250ML-% IV SOLN
INTRAVENOUS | Status: AC
  Filled 2023-02-05: qty 250

## 2023-02-05 MED ORDER — DEXTROSE IN LACTATED RINGERS 5 % IV SOLN: INTRAVENOUS | Status: DC

## 2023-02-05 MED ORDER — FENTANYL CITRATE (PF) 100 MCG/2ML IJ SOLN
INTRAMUSCULAR | Status: AC
  Filled 2023-02-05: qty 2

## 2023-02-05 MED ORDER — KETOROLAC TROMETHAMINE 30 MG/ML IJ SOLN
30.0000 mg | Freq: Four times a day (QID) | INTRAMUSCULAR | Status: AC | PRN
  Administered 2023-02-05: 30 mg via INTRAVENOUS
  Filled 2023-02-05: qty 1

## 2023-02-05 MED ORDER — OXYTOCIN-SODIUM CHLORIDE 30-0.9 UT/500ML-% IV SOLN
2.5000 [IU]/h | INTRAVENOUS | Status: AC
  Administered 2023-02-05: 2.5 [IU]/h via INTRAVENOUS
  Filled 2023-02-05: qty 500

## 2023-02-05 MED ORDER — MENTHOL 3 MG MT LOZG: 1.0000 | LOZENGE | OROMUCOSAL | Status: DC | PRN

## 2023-02-05 MED ORDER — OXYTOCIN-SODIUM CHLORIDE 30-0.9 UT/500ML-% IV SOLN
INTRAVENOUS | Status: AC
  Filled 2023-02-05: qty 500

## 2023-02-05 MED ORDER — MORPHINE SULFATE (PF) 0.5 MG/ML IJ SOLN
INTRAMUSCULAR | Status: AC
  Filled 2023-02-05: qty 10

## 2023-02-05 MED ORDER — OXYCODONE HCL 5 MG PO TABS
5.0000 mg | ORAL_TABLET | Freq: Four times a day (QID) | ORAL | Status: DC | PRN
  Filled 2023-02-05 (×2): qty 1

## 2023-02-05 MED ORDER — SODIUM CHLORIDE 0.9 % IV SOLN
2.0000 g | INTRAVENOUS | Status: AC
  Administered 2023-02-05: 2 g via INTRAVENOUS

## 2023-02-05 MED ORDER — LACTATED RINGERS IV SOLN: INTRAVENOUS | Status: DC

## 2023-02-05 MED ORDER — POVIDONE-IODINE 10 % EX SWAB
2.0000 | Freq: Once | CUTANEOUS | Status: AC
  Administered 2023-02-05: 2 via TOPICAL

## 2023-02-05 MED ORDER — ONDANSETRON HCL 4 MG/2ML IJ SOLN
INTRAMUSCULAR | Status: AC
  Filled 2023-02-05: qty 2

## 2023-02-05 MED ORDER — ONDANSETRON HCL 4 MG/2ML IJ SOLN
INTRAMUSCULAR | Status: DC | PRN
  Administered 2023-02-05: 4 mg via INTRAVENOUS

## 2023-02-05 MED ORDER — PROMETHAZINE HCL 25 MG/ML IJ SOLN: 6.2500 mg | INTRAMUSCULAR | Status: DC | PRN

## 2023-02-05 MED ORDER — DEXAMETHASONE SODIUM PHOSPHATE 4 MG/ML IJ SOLN
INTRAMUSCULAR | Status: AC
  Filled 2023-02-05: qty 1

## 2023-02-05 MED ORDER — SCOPOLAMINE 1 MG/3DAYS TD PT72
1.0000 | MEDICATED_PATCH | Freq: Once | TRANSDERMAL | Status: DC
  Administered 2023-02-05: 1.5 mg via TRANSDERMAL
  Filled 2023-02-05: qty 1

## 2023-02-05 MED ORDER — SODIUM CHLORIDE 0.9% FLUSH: 3.0000 mL | INTRAVENOUS | Status: DC | PRN

## 2023-02-05 MED ORDER — MEPERIDINE HCL 25 MG/ML IJ SOLN: 6.2500 mg | INTRAMUSCULAR | Status: DC | PRN

## 2023-02-05 MED ORDER — BUPIVACAINE IN DEXTROSE 0.75-8.25 % IT SOLN
INTRATHECAL | Status: DC | PRN
  Administered 2023-02-05: 1.4 mL via INTRATHECAL

## 2023-02-05 MED ORDER — SIMETHICONE 80 MG PO CHEW: 80.0000 mg | CHEWABLE_TABLET | ORAL | Status: DC | PRN

## 2023-02-05 MED ORDER — HYDROMORPHONE HCL 1 MG/ML IJ SOLN: 0.2500 mg | INTRAMUSCULAR | Status: DC | PRN

## 2023-02-05 MED ORDER — LACTATED RINGERS IV BOLUS
500.0000 mL | Freq: Once | INTRAVENOUS | Status: AC
  Administered 2023-02-05: 500 mL via INTRAVENOUS

## 2023-02-05 MED ORDER — ZOLPIDEM TARTRATE 5 MG PO TABS: 5.0000 mg | ORAL_TABLET | Freq: Every evening | ORAL | Status: DC | PRN

## 2023-02-05 MED ORDER — SIMETHICONE 80 MG PO CHEW
80.0000 mg | CHEWABLE_TABLET | Freq: Three times a day (TID) | ORAL | Status: DC
  Administered 2023-02-05 – 2023-02-07 (×6): 80 mg via ORAL
  Filled 2023-02-05 (×6): qty 1

## 2023-02-05 MED ORDER — HYDROMORPHONE HCL 1 MG/ML IJ SOLN
0.5000 mg | INTRAMUSCULAR | Status: DC | PRN
  Administered 2023-02-05: 0.5 mg via INTRAVENOUS
  Filled 2023-02-05: qty 1

## 2023-02-05 MED ORDER — MORPHINE SULFATE (PF) 0.5 MG/ML IJ SOLN
INTRAMUSCULAR | Status: DC | PRN
  Administered 2023-02-05: 150 ug via INTRATHECAL

## 2023-02-05 MED ORDER — SODIUM CHLORIDE 0.9 % IV SOLN
INTRAVENOUS | Status: AC
  Filled 2023-02-05: qty 2

## 2023-02-05 MED ORDER — ACETAMINOPHEN 10 MG/ML IV SOLN
INTRAVENOUS | Status: DC | PRN
  Administered 2023-02-05: 1000 mg via INTRAVENOUS

## 2023-02-05 MED ORDER — ACETAMINOPHEN 10 MG/ML IV SOLN
INTRAVENOUS | Status: AC
  Filled 2023-02-05: qty 100

## 2023-02-05 MED ORDER — KETOROLAC TROMETHAMINE 30 MG/ML IJ SOLN: 30.0000 mg | Freq: Once | INTRAMUSCULAR | Status: DC | PRN

## 2023-02-05 MED ORDER — SENNOSIDES-DOCUSATE SODIUM 8.6-50 MG PO TABS
2.0000 | ORAL_TABLET | Freq: Every day | ORAL | Status: DC
  Administered 2023-02-06 – 2023-02-07 (×2): 2 via ORAL
  Filled 2023-02-05 (×2): qty 2

## 2023-02-05 MED ORDER — KETOROLAC TROMETHAMINE 30 MG/ML IJ SOLN
INTRAMUSCULAR | Status: AC
  Filled 2023-02-05: qty 1

## 2023-02-05 MED ORDER — STERILE WATER FOR IRRIGATION IR SOLN
Status: DC | PRN
  Administered 2023-02-05: 1000 mL

## 2023-02-05 MED ORDER — PRENATAL MULTIVITAMIN CH
1.0000 | ORAL_TABLET | Freq: Every day | ORAL | Status: DC
  Administered 2023-02-06 – 2023-02-07 (×2): 1 via ORAL
  Filled 2023-02-05 (×2): qty 1

## 2023-02-05 MED ORDER — COCONUT OIL OIL: 1.0000 | TOPICAL_OIL | Status: DC | PRN

## 2023-02-05 MED ORDER — DIPHENHYDRAMINE HCL 25 MG PO CAPS
25.0000 mg | ORAL_CAPSULE | ORAL | Status: DC | PRN
  Filled 2023-02-05: qty 1

## 2023-02-05 MED ORDER — NALOXONE HCL 4 MG/10ML IJ SOLN
1.0000 ug/kg/h | INTRAVENOUS | Status: DC | PRN
  Filled 2023-02-05: qty 5

## 2023-02-05 MED ORDER — IBUPROFEN 600 MG PO TABS
600.0000 mg | ORAL_TABLET | Freq: Four times a day (QID) | ORAL | Status: DC
  Administered 2023-02-06 – 2023-02-07 (×7): 600 mg via ORAL
  Filled 2023-02-05 (×7): qty 1

## 2023-02-05 MED ORDER — OXYCODONE HCL 5 MG PO TABS
5.0000 mg | ORAL_TABLET | ORAL | Status: DC | PRN
  Administered 2023-02-06 – 2023-02-07 (×2): 5 mg via ORAL

## 2023-02-05 MED ORDER — DIPHENHYDRAMINE HCL 25 MG PO CAPS
25.0000 mg | ORAL_CAPSULE | Freq: Four times a day (QID) | ORAL | Status: DC | PRN
  Administered 2023-02-05 – 2023-02-06 (×2): 25 mg via ORAL
  Filled 2023-02-05: qty 1

## 2023-02-05 MED ORDER — ACETAMINOPHEN 500 MG PO TABS
1000.0000 mg | ORAL_TABLET | Freq: Four times a day (QID) | ORAL | Status: AC
  Administered 2023-02-05 – 2023-02-06 (×4): 1000 mg via ORAL
  Filled 2023-02-05 (×4): qty 2

## 2023-02-05 MED ORDER — PHENYLEPHRINE HCL-NACL 20-0.9 MG/250ML-% IV SOLN
INTRAVENOUS | Status: DC | PRN
  Administered 2023-02-05: 60 ug/min via INTRAVENOUS

## 2023-02-05 MED ORDER — SODIUM CHLORIDE 0.9 % IR SOLN
Status: DC | PRN
  Administered 2023-02-05: 1000 mL

## 2023-02-05 MED ORDER — WITCH HAZEL-GLYCERIN EX PADS: 1.0000 | MEDICATED_PAD | CUTANEOUS | Status: DC | PRN

## 2023-02-05 MED ORDER — OXYTOCIN-SODIUM CHLORIDE 30-0.9 UT/500ML-% IV SOLN
INTRAVENOUS | Status: DC | PRN
  Administered 2023-02-05: 300 mL via INTRAVENOUS

## 2023-02-05 MED ORDER — ONDANSETRON HCL 4 MG/2ML IJ SOLN
4.0000 mg | Freq: Three times a day (TID) | INTRAMUSCULAR | Status: DC | PRN
  Administered 2023-02-05: 4 mg via INTRAVENOUS
  Filled 2023-02-05: qty 2

## 2023-02-05 MED ORDER — KETOROLAC TROMETHAMINE 30 MG/ML IJ SOLN
INTRAMUSCULAR | Status: DC | PRN
  Administered 2023-02-05: 30 mg via INTRAVENOUS

## 2023-02-05 MED ORDER — CEFAZOLIN SODIUM-DEXTROSE 2-4 GM/100ML-% IV SOLN
INTRAVENOUS | Status: AC
  Filled 2023-02-05: qty 100

## 2023-02-05 MED ORDER — DIBUCAINE (PERIANAL) 1 % EX OINT: 1.0000 | TOPICAL_OINTMENT | CUTANEOUS | Status: DC | PRN

## 2023-02-05 MED ORDER — KETOROLAC TROMETHAMINE 30 MG/ML IJ SOLN: 30.0000 mg | Freq: Four times a day (QID) | INTRAMUSCULAR | Status: AC | PRN

## 2023-02-05 MED ORDER — DEXAMETHASONE SODIUM PHOSPHATE 10 MG/ML IJ SOLN
INTRAMUSCULAR | Status: DC | PRN
  Administered 2023-02-05: 4 mg via INTRAVENOUS

## 2023-02-05 MED ORDER — DIPHENHYDRAMINE HCL 50 MG/ML IJ SOLN
12.5000 mg | INTRAMUSCULAR | Status: DC | PRN
  Administered 2023-02-05 (×2): 12.5 mg via INTRAVENOUS
  Filled 2023-02-05 (×2): qty 1

## 2023-02-05 MED ORDER — NALOXONE HCL 0.4 MG/ML IJ SOLN: 0.4000 mg | INTRAMUSCULAR | Status: DC | PRN

## 2023-02-05 SURGICAL SUPPLY — 35 items
APL PRP STRL LF DISP 70% ISPRP (MISCELLANEOUS) ×2
APL SKNCLS STERI-STRIP NONHPOA (GAUZE/BANDAGES/DRESSINGS) ×1
BARRIER ADHS 3X4 INTERCEED (GAUZE/BANDAGES/DRESSINGS) IMPLANT
BENZOIN TINCTURE PRP APPL 2/3 (GAUZE/BANDAGES/DRESSINGS) IMPLANT
BRR ADH 4X3 ABS CNTRL BYND (GAUZE/BANDAGES/DRESSINGS)
CHLORAPREP W/TINT 26 (MISCELLANEOUS) ×2 IMPLANT
CLAMP UMBILICAL CORD (MISCELLANEOUS) ×1 IMPLANT
CLOTH BEACON ORANGE TIMEOUT ST (SAFETY) ×1 IMPLANT
DRSG OPSITE POSTOP 4X10 (GAUZE/BANDAGES/DRESSINGS) ×1 IMPLANT
ELECT REM PT RETURN 9FT ADLT (ELECTROSURGICAL) ×1
ELECTRODE REM PT RTRN 9FT ADLT (ELECTROSURGICAL) ×1 IMPLANT
EXTRACTOR VACUUM M CUP 4 TUBE (SUCTIONS) IMPLANT
GAUZE SPONGE 4X4 12PLY STRL LF (GAUZE/BANDAGES/DRESSINGS) IMPLANT
GLOVE BIO SURGEON STRL SZ 6.5 (GLOVE) ×1 IMPLANT
GLOVE BIOGEL PI IND STRL 7.0 (GLOVE) ×1 IMPLANT
GOWN STRL REUS W/TWL LRG LVL3 (GOWN DISPOSABLE) ×2 IMPLANT
KIT ABG SYR 3ML LUER SLIP (SYRINGE) IMPLANT
NDL HYPO 25X5/8 SAFETYGLIDE (NEEDLE) ×1 IMPLANT
NEEDLE HYPO 22GX1.5 SAFETY (NEEDLE) IMPLANT
NEEDLE HYPO 25X5/8 SAFETYGLIDE (NEEDLE) ×1 IMPLANT
NS IRRIG 1000ML POUR BTL (IV SOLUTION) ×1 IMPLANT
PACK C SECTION WH (CUSTOM PROCEDURE TRAY) ×1 IMPLANT
PAD OB MATERNITY 4.3X12.25 (PERSONAL CARE ITEMS) ×1 IMPLANT
STRIP CLOSURE SKIN 1/2X4 (GAUZE/BANDAGES/DRESSINGS) IMPLANT
SUT CHROMIC 0 CTX 36 (SUTURE) ×2 IMPLANT
SUT PLAIN 0 NONE (SUTURE) IMPLANT
SUT PLAIN 2 0 XLH (SUTURE) IMPLANT
SUT VIC AB 0 CT1 27 (SUTURE) ×3
SUT VIC AB 0 CT1 27XBRD ANBCTR (SUTURE) ×3 IMPLANT
SUT VIC AB 4-0 KS 27 (SUTURE) IMPLANT
SYR CONTROL 10ML LL (SYRINGE) IMPLANT
TOWEL OR 17X24 6PK STRL BLUE (TOWEL DISPOSABLE) ×1 IMPLANT
TRAY FOLEY W/BAG SLVR 14FR LF (SET/KITS/TRAYS/PACK) ×1 IMPLANT
VACUUM CUP M-STYLE MYSTIC II (SUCTIONS) IMPLANT
WATER STERILE IRR 1000ML POUR (IV SOLUTION) ×1 IMPLANT

## 2023-02-05 NOTE — Brief Op Note (Signed)
02/05/2023  8:32 AM  PATIENT:  Stephanie Hudson  32 y.o. female  PRE-OPERATIVE DIAGNOSIS:  IUP at 39 weeks Declines vaginal delivery  POST-OPERATIVE DIAGNOSIS:  IUP at 39 weeks Declines vaginal delivery  PROCEDURE:  Procedure(s): PRIMARY CESAREAN SECTION EDC: 02-12-23  ALLERG: LATEX (N/A)  SURGEON:  Surgeon(s) and Role:    * Dian Queen, MD - Primary  PHYSICIAN ASSISTANT:   ASSISTANTS: none   ANESTHESIA:   spinal  EBL:  per anesthesia record    BLOOD ADMINISTERED:none  DRAINS: Urinary Catheter (Foley)   LOCAL MEDICATIONS USED:  NONE  SPECIMEN:  No Specimen  DISPOSITION OF SPECIMEN:  N/A  COUNTS:  YES  TOURNIQUET:  * No tourniquets in log *  DICTATION: .Other Dictation: Dictation Number dictated  PLAN OF CARE: Admit to inpatient   PATIENT DISPOSITION:  PACU - hemodynamically stable.   Delay start of Pharmacological VTE agent (>24hrs) due to surgical blood loss or risk of bleeding: not applicable

## 2023-02-05 NOTE — Progress Notes (Signed)
Patient doing well No changes Proceed with Primary LTCS Consent is signed

## 2023-02-05 NOTE — Anesthesia Postprocedure Evaluation (Signed)
Anesthesia Post Note  Patient: Stephanie Hudson  Procedure(s) Performed: PRIMARY CESAREAN SECTION EDC: 02-12-23  ALLERG: LATEX (Abdomen)     Patient location during evaluation: PACU Anesthesia Type: Spinal Level of consciousness: awake Pain management: pain level controlled Vital Signs Assessment: post-procedure vital signs reviewed and stable Respiratory status: spontaneous breathing Cardiovascular status: stable Postop Assessment: no headache, no backache, spinal receding, patient able to bend at knees and no apparent nausea or vomiting Anesthetic complications: no  No notable events documented.  Last Vitals:  Vitals:   02/05/23 0956 02/05/23 1000  BP: 110/73 113/77  Pulse: (!) 55 (!) 53  Resp: 14 14  Temp: 36.4 C   SpO2: 100% 100%    Last Pain:  Vitals:   02/05/23 0956  TempSrc: Oral  PainSc:    Pain Goal:    LLE Motor Response: No movement due to regional block (02/05/23 0945) LLE Sensation: Numbness, Tingling (02/05/23 0945) RLE Motor Response: No movement due to regional block (02/05/23 0945) RLE Sensation: Numbness, Tingling (02/05/23 0945)     Epidural/Spinal Function Cutaneous sensation: Tingles (02/05/23 0945), Patient able to flex knees: Yes (02/05/23 0945), Patient able to lift hips off bed: No (02/05/23 0945), Back pain beyond tenderness at insertion site: No (02/05/23 0945), Progressively worsening motor and/or sensory loss: No (02/05/23 0945), Bowel and/or bladder incontinence post epidural: No (02/05/23 0945)  Huston Foley

## 2023-02-05 NOTE — Op Note (Signed)
[name], Stephanie Hudson ACCOUNT NO: [id number] DATE OF BIRTH: January 23, 1991 FACILITY: [facility] LOCATION: [facility] PHYSICIAN: Sharyn Lull L. Helane Rima, MD  Operative Report   DATE OF PROCEDURE: 02/05/2023  PREOPERATIVE DIAGNOSIS:  Intrauterine pregnancy at 39 weeks and declines vaginal delivery.  POSTOPERATIVE DIAGNOSIS:  Intrauterine pregnancy at 39 weeks and declines vaginal delivery and large for gestational age.  PROCEDURE:  Primary low transverse cesarean section.  SURGEON:  Debbie Yearick L. Naim Murtha, MD  ANESTHESIA:  Spinal.  ESTIMATED BLOOD LOSS:  Less than 500 mL.  COMPLICATIONS:  None.  DRAINS:  Foley.  DESCRIPTION OF PROCEDURE:  The patient was taken to the operating room.  She was prepped and draped after spinal was placed by Dr. Jillyn Hidden. The Allis test was performed and a low transverse incision was made with a scalpel.  It was carried down to the  fascia and the fascia was scored in the midline and extended laterally.  The fascia was separated from the underlying rectus muscles with sharp and blunt dissection.  The rectus muscles were separated in the midline.  Peritoneum was entered bluntly and  the peritoneal incision was then stretched.  The lower uterine segment was identified and the bladder flap was created with sharp and blunt dissection.  A low transverse incision was made in the uterus.  The uterus was entered using a hemostat.  Amniotic  fluid was clear.  The baby was in cephalic presentation and was delivered easily with a vacuum extractor.  The baby was large for gestational age and was a vigorous female on the abdomen.  The cord was clamped and cut.  The baby was handed to the neonatal  team.  The placenta was removed after cord blood was obtained.  It was noted to be normal intact with a 3-vessel cord.  The uterus was exteriorized and the uterine cavity was cleared of all clots and debris and the incision was closed in 1 layer using 0  chromic in a  running locked stitch.  Irrigation was performed.  Hemostasis was noted.  The uterus was returned to the abdomen.  Hemostasis was again noted.  The peritoneum was closed using 0 Vicryl.  The fascia was closed using 0 Vicryl starting in each  corner meeting in the midline.  After irrigation of subcutaneous layer, the skin was closed, after the fascia was closed in a running stitch and the peritoneum had been closed the subcutaneous was closed with plain gut interrupted and the skin was  closed with a 3-0 Vicryl on a Keith needle.  Benzoin, Steri-Strips and a honeycomb dressing were applied.  All sponge, lap and instrument counts were correct x2.  The patient went to recovery room in stable condition.   PUS D: 02/05/2023 12:33:28 pm T: 02/05/2023 12:58:00 pm  JOB: [id number]

## 2023-02-05 NOTE — Anesthesia Procedure Notes (Signed)
Spinal  Patient location during procedure: OR Start time: 02/05/2023 7:35 AM End time: 02/05/2023 7:38 AM Reason for block: surgical anesthesia Staffing Performed: anesthesiologist  Anesthesiologist: Lyn Hollingshead, MD Performed by: Lyn Hollingshead, MD Authorized by: Lyn Hollingshead, MD   Preanesthetic Checklist Completed: patient identified, IV checked, site marked, risks and benefits discussed, surgical consent, monitors and equipment checked, pre-op evaluation and timeout performed Spinal Block Patient position: sitting Prep: DuraPrep and site prepped and draped Patient monitoring: continuous pulse ox and blood pressure Approach: midline Location: L3-4 Injection technique: single-shot Needle Needle type: Pencan  Needle gauge: 24 G Needle length: 10 cm Needle insertion depth: 6 cm Assessment Sensory level: T4 Events: CSF return

## 2023-02-05 NOTE — Anesthesia Preprocedure Evaluation (Addendum)
Anesthesia Evaluation  Patient identified by MRN, date of birth, ID band Patient awake    Reviewed: Allergy & Precautions, H&P , NPO status , Patient's Chart, lab work & pertinent test results, reviewed documented beta blocker date and time   Airway Mallampati: I  TM Distance: >3 FB Neck ROM: full    Dental no notable dental hx. (+) Teeth Intact, Dental Advisory Given   Pulmonary neg pulmonary ROS   Pulmonary exam normal breath sounds clear to auscultation       Cardiovascular negative cardio ROS Normal cardiovascular exam Rhythm:regular Rate:Normal     Neuro/Psych  Headaches  Anxiety      negative psych ROS   GI/Hepatic negative GI ROS, Neg liver ROS,,,  Endo/Other   Hyperthyroidism   Renal/GU negative Renal ROS  negative genitourinary   Musculoskeletal   Abdominal   Peds  Hematology  (+) Blood dyscrasia, anemia   Anesthesia Other Findings POTS   Reproductive/Obstetrics (+) Pregnancy                             Anesthesia Physical Anesthesia Plan  ASA: 2  Anesthesia Plan: Spinal   Post-op Pain Management: Minimal or no pain anticipated   Induction: Intravenous  PONV Risk Score and Plan: 2 and Treatment may vary due to age or medical condition  Airway Management Planned: Simple Face Mask  Additional Equipment: None  Intra-op Plan:   Post-operative Plan:   Informed Consent: I have reviewed the patients History and Physical, chart, labs and discussed the procedure including the risks, benefits and alternatives for the proposed anesthesia with the patient or authorized representative who has indicated his/her understanding and acceptance.       Plan Discussed with: Anesthesiologist  Anesthesia Plan Comments: (  )       Anesthesia Quick Evaluation

## 2023-02-05 NOTE — Transfer of Care (Signed)
Immediate Anesthesia Transfer of Care Note  Patient: Stephanie Hudson  Procedure(s) Performed: PRIMARY CESAREAN SECTION EDC: 02-12-23  ALLERG: LATEX (Abdomen)  Patient Location: PACU  Anesthesia Type:Spinal  Level of Consciousness: awake  Airway & Oxygen Therapy: Patient Spontanous Breathing  Post-op Assessment: Report given to RN and Post -op Vital signs reviewed and stable  Post vital signs: Reviewed and stable  Last Vitals:  Vitals Value Taken Time  BP 107/58 02/05/23 0845  Temp    Pulse 76 02/05/23 0845  Resp 11 02/05/23 0845  SpO2 100 % 02/05/23 0845  Vitals shown include unvalidated device data.  Last Pain:  Vitals:   02/05/23 0541  TempSrc: Oral         Complications: No notable events documented.

## 2023-02-05 NOTE — Lactation Note (Addendum)
This note was copied from a baby's chart. Lactation Consultation Note  Patient Name: Stephanie Hudson [id number] Date: 02/05/2023 Age:31 hours Reason for consult: Initial assessment;Term;Breastfeeding assistance Per mom baby latched after delivery and fed for awhile with swallows.  LC offered to assist , diaper dry and HNV or HNS.  Baby spit up a small amount prior to latching at this consult ( mucous and undigested colostrum )  Baby  easily latched on the right breast with depth and fed 20 mins with swallows and per mom comfortable.  Mom shared about her milk supply decreasing during the time she had developed a thyroid nodule with her 1st baby. ( See below )  This pregnancy her breast didn't change in size, areola darkened, and nipples/ breast sensitive. No leakage. Last week she had developed a thyroid nodule and labs were drawn. Patient plans to talk to Dr, Helane Rima  Mom expressed concerns with her milk supply due to issues with her 1st baby with MS. Per mom also have POTS Syndrome.  LC plan for today :  Feed with feeding cues and by 3 hours STS .  Hand express prior to latch and prepump if needed.  Tomorrow a DEBP can be setup.    Maternal Data Has patient been taught Hand Expression?:  (per mom remembers from her 1st baby) Does the patient have breastfeeding experience prior to this delivery?: Yes How long did the patient breastfeed?: per mom for a couple of months and the her volune decreased and she just pump for 1 month and only would get 2 oz.  Feeding Mother's Current Feeding Choice: Breast Milk  LATCH Score Latch: Grasps breast easily, tongue down, lips flanged, rhythmical sucking.  Audible Swallowing: Spontaneous and intermittent  Type of Nipple: Everted at rest and after stimulation  Comfort (Breast/Nipple): Soft / non-tender  Hold (Positioning): Assistance needed to correctly position infant at breast and maintain latch.  LATCH Score: 9   Lactation Tools  Discussed/Used Tools: Pump;Flanges Flange Size: 21;24;Other (comment) (#21 was the better fit) Breast pump type: Manual Pump Education: Milk Storage;Setup, frequency, and cleaning Reason for Pumping: For pre- pumping if needed  Interventions Interventions: Breast feeding basics reviewed;Assisted with latch;Skin to skin;Breast compression;Adjust position;Support pillows;Position options;Hand pump;Education;LC Services brochure  Discharge Pump: Personal;Manual;DEBP WIC Program: No  Consult Status Consult Status: Follow-up Date: 02/06/23 Follow-up type: In-patient    [place] 02/05/2023, 12:46 PM

## 2023-02-06 LAB — CBC
HCT: 23.3 % — ABNORMAL LOW (ref 36.0–46.0)
Hemoglobin: 7.6 g/dL — ABNORMAL LOW (ref 12.0–15.0)
MCH: 28.8 pg (ref 26.0–34.0)
MCHC: 32.6 g/dL (ref 30.0–36.0)
MCV: 88.3 fL (ref 80.0–100.0)
Platelets: 188 10*3/uL (ref 150–400)
RBC: 2.64 MIL/uL — ABNORMAL LOW (ref 3.87–5.11)
RDW: 13.6 % (ref 11.5–15.5)
WBC: 10.9 10*3/uL — ABNORMAL HIGH (ref 4.0–10.5)
nRBC: 0 % (ref 0.0–0.2)

## 2023-02-06 LAB — BIRTH TISSUE RECOVERY COLLECTION (PLACENTA DONATION)

## 2023-02-06 MED ORDER — IRON SUCROSE 500 MG IVPB - SIMPLE MED
500.0000 mg | Freq: Once | INTRAVENOUS | Status: AC
  Administered 2023-02-06: 500 mg via INTRAVENOUS
  Filled 2023-02-06: qty 275

## 2023-02-06 MED ORDER — SODIUM CHLORIDE 0.9 % IV SOLN
INTRAVENOUS | Status: DC | PRN
  Administered 2023-02-06: 10 mL via INTRAVENOUS

## 2023-02-06 NOTE — Lactation Note (Signed)
This note was copied from a baby's chart. Lactation Consultation Note  Patient Name: Stephanie Hudson [id number] Date: 02/06/2023 Age:31 hours Reason for consult: Follow-up assessment;Mother's request;Infant weight loss (weight loss -5.01%. infant is LGA, C/S delivery.) P2, term female infant. Birth Parent requested LC due questions: infant is currently BF for 10 minutes each on each breast, total 20 minutes per feeding. Starting improve on feeds again since circumcision earlier today. Birth Parent question was how to keep infant awake for feedings, LC suggested Skin to Skin, doing breast compressions when feeding infant, take infant off breast to burp and re-latch infant. LC did not observe latch due infant recently BF for 20 minutes at 2100 pm. Birth Parent will continue to BF infant by cues, on demand, 8 to 12+ times within 24 hours, skin to skin. Birth Parent knows she can hand express and give infant colostrum prior to latch if infant is sleepy, do skin to skin or have infant suck on clean finger to stimulate sucking response and then latch. If needs further latch assistance call RN/LC .  Maternal Data    Feeding Mother's Current Feeding Choice: Breast Milk  LATCH Score                    Lactation Tools Discussed/Used    Interventions Interventions: Education;Hand express;Breast compression;Position options;Skin to skin  Discharge    Consult Status Consult Status: Follow-up Date: 02/07/23 Follow-up type: In-patient    Eulis Canner 02/06/2023, 10:53 PM

## 2023-02-06 NOTE — Progress Notes (Signed)
Subjective: Postpartum Day 1: Cesarean Delivery Patient reports tolerating PO and no problems voiding.   Wants IV Fe Objective: Vital signs in last 24 hours: Temp:  [97.5 F (36.4 C)-98 F (36.7 C)] 98 F (36.7 C) (03/27 0615) Pulse Rate:  [53-74] 69 (03/27 0615) Resp:  [12-18] 18 (03/27 0615) BP: (108-117)/(58-77) 109/59 (03/27 0615) SpO2:  [99 %-100 %] 99 % (03/27 0615)  Physical Exam:  General: alert, cooperative, appears stated age, and no distress Lochia: appropriate Uterine Fundus: firm Incision: healing well DVT Evaluation: No evidence of DVT seen on physical exam.  Recent Labs    02/04/23 0930 02/06/23 0559  HGB 10.2* 7.6*  HCT 32.7* 23.3*    Assessment/Plan: Status post Cesarean section. Postoperative course complicated by acute blood loss anemia.  Wants to get IV Fe infusion (had this last pregnancy and it worked well for her)   Continue current care Discussed circumcision R&B.  Luz Lex, MD 02/06/2023, 9:44 AM

## 2023-02-06 NOTE — Social Work (Signed)
MOB was referred for history of depression/anxiety.  * Referral screened out by Clinical Social Worker because none of the following criteria appear to apply:  ~ History of anxiety/depression during this pregnancy, or of post-partum depression following prior delivery.  ~ Diagnosis of anxiety and/or depression within last 3 years OR * MOB's symptoms currently being treated with medication and/or therapy. Per OB records MOB is taking Sertraline 100 mg.   Please contact the Clinical Social Worker if needs arise, by [facility] request, or if MOB scores greater than 9/yes to question 10 on [place] Postpartum Depression Screen.  Letta Kocher, [place] Social Worker [phone]

## 2023-02-07 MED ORDER — ACETAMINOPHEN 325 MG PO TABS: 650.0000 mg | ORAL_TABLET | ORAL | 0 refills | Status: AC | PRN

## 2023-02-07 MED ORDER — IBUPROFEN 600 MG PO TABS: 600.0000 mg | ORAL_TABLET | Freq: Four times a day (QID) | ORAL | 0 refills | Status: AC

## 2023-02-07 MED ORDER — OXYCODONE HCL 5 MG PO TABS: 5.0000 mg | ORAL_TABLET | Freq: Four times a day (QID) | ORAL | 0 refills | Status: DC | PRN

## 2023-02-07 NOTE — Lactation Note (Signed)
This note was copied from a baby's chart. Lactation Consultation Note  Patient Name: Stephanie Hudson [id number] Date: 02/07/2023 Age:31 hours Reason for consult: Follow-up assessment;Maternal endocrine disorder  P2, Mother states she is relieved because she noticed ample amounts of transitional breastmilk with last feeding.  Feed on demand with cues.  Goal 8-12+ times per day after first 24 hrs.  Reviewed engorgement care and monitoring voids/stools. She plans to do some pumping in addition to breastfeeding occasionally to boost her milk supply as directed by [facility] IBCLC. Mother has OP lactation follow up at [facility] MD office.   Feeding Mother's Current Feeding Choice: Breast Milk and Formula  LATCH Score Latch: Grasps breast easily, tongue down, lips flanged, rhythmical sucking.  Audible Swallowing: A few with stimulation  Type of Nipple: Everted at rest and after stimulation  Comfort (Breast/Nipple): Soft / non-tender  Hold (Positioning): No assistance needed to correctly position infant at breast.  LATCH Score: 9   Lactation Tools Discussed/Used  DEBP  Interventions Interventions: Education  Discharge Discharge Education: Engorgement and breast care;Warning signs for feeding baby;Outpatient recommendation Pump: Personal  Consult Status Consult Status: Complete Date: 02/07/23    Vivianne Master [facility] 02/07/2023, 10:53 AM

## 2023-02-07 NOTE — Progress Notes (Signed)
Postpartum Progress Note  Postpartum Day 2 s/p primary Cesarean section.  Subjective:  Patient reports no overnight events.  She reports well controlled pain, ambulating without difficulty, voiding spontaneously, tolerating PO.  She reports Positive flatus, Negative BM.  Vaginal bleeding is minimal.  Objective: Blood pressure 112/70, pulse 73, temperature 97.9 F (36.6 C), temperature source Oral, resp. rate 18, height 5' 7.5" (1.715 m), weight 95.3 kg, SpO2 99 %, unknown if currently breastfeeding.  Physical Exam:  General: alert and no distress Lochia: appropriate Uterine Fundus: firm Incision: dressing in place DVT Evaluation: No evidence of DVT seen on physical exam.  Recent Labs    02/04/23 0930 02/06/23 0559  HGB 10.2* 7.6*  HCT 32.7* 23.3*    Assessment/Plan: Postpartum Day 2, s/p C-section Acute blood loss anemia - No signs or symptoms of anemia. S/p IV Fe.  Precautions reviewed, will have the office schedule a HGB check in 2 weeks. She has required iron infusion in the postpartum period after her first pregnancy.  Lactation following Doing well, continue routine postpartum care. Anticipate discharge today.   LOS: 2 days   [name] 02/07/2023, 7:42 AM

## 2023-02-07 NOTE — Discharge Summary (Signed)
Obstetric Discharge Summary  Stephanie Hudson is a 32 y.o. female that presented on 02/05/2023 for primary C section for history of 3rd degree tear.  She delivered a viable female infant on 02/05/2023. She received IV iron infusion on PPD#1 for anemia.  Her postpartum course was otherwise uncomplicated and on PPD#2, she reported well controlled pain, spontaneous voiding, ambulating without difficulty, and tolerating PO.  She was stable for discharge home on 02/07/2023 with plans for in-office follow up.  Hemoglobin  Date Value Ref Range Status  02/06/2023 7.6 (L) 12.0 - 15.0 g/dL Final    Comment:    REPEATED TO VERIFY  01/18/2022 13.4 11.1 - 15.9 g/dL Final   HCT  Date Value Ref Range Status  02/06/2023 23.3 (L) 36.0 - 46.0 % Final   Hematocrit  Date Value Ref Range Status  01/18/2022 39.3 34.0 - 46.6 % Final    Physical Exam:  General: alert and no distress Lochia: appropriate Uterine Fundus: firm Incision: healing well DVT Evaluation: No evidence of DVT seen on physical exam.  Discharge Diagnoses: Term Pregnancy-delivered  Discharge Information: Date: 02/07/2023 Activity: Pelvic rest, as tolerated Diet: routine Medications: Tylenol, motrin, oxycodone  Condition: stable Instructions: Refer to practice specific booklet.  Discussed prior to discharge.  Discharge to: [place] Follow up.   Why: The office will schedule a hemoglobin check and a 6 week postpartum visit. Contact information: [address] [place] [address] [phone]                 Newborn Data: Live born female  Birth Weight: 9 lb 8 oz (4310 g) APGAR: 60, 31  Newborn Delivery   Birth date/time: 02/05/2023 08:05:17 Delivery type: C-Section, Vacuum Assisted Trial of labor: No C-section categorization: Primary      Home with mother.  Stephanie Hudson 02/07/2023, 8:55 PM

## 2023-02-14 ENCOUNTER — Telehealth (HOSPITAL_COMMUNITY): Payer: Self-pay

## 2023-02-14 NOTE — Telephone Encounter (Signed)
Chart review.

## 2023-02-16 ENCOUNTER — Telehealth (HOSPITAL_COMMUNITY): Payer: Self-pay

## 2023-02-16 NOTE — Telephone Encounter (Signed)
Patient did not answer phone call. Voicemail left for patient.   Suann Larry [facility]   02/16/23,1132

## 2023-04-11 ENCOUNTER — Other Ambulatory Visit: Payer: Self-pay | Admitting: Obstetrics and Gynecology

## 2023-04-11 DIAGNOSIS — E041 Nontoxic single thyroid nodule: Secondary | ICD-10-CM

## 2023-04-22 ENCOUNTER — Inpatient Hospital Stay
Admission: RE | Admit: 2023-04-22 | Discharge: 2023-04-22 | Disposition: A | Discharge status: Home / Self Care | Payer: Self-pay | Source: Ambulatory Visit | Attending: Family Medicine | Admitting: Family Medicine

## 2023-04-22 ENCOUNTER — Other Ambulatory Visit: Payer: Self-pay | Admitting: Family Medicine

## 2023-04-22 DIAGNOSIS — E041 Nontoxic single thyroid nodule: Secondary | ICD-10-CM

## 2023-04-23 ENCOUNTER — Other Ambulatory Visit: Payer: Self-pay | Admitting: Surgery

## 2023-04-23 DIAGNOSIS — E041 Nontoxic single thyroid nodule: Secondary | ICD-10-CM

## 2023-05-17 ENCOUNTER — Ambulatory Visit
Admission: RE | Admit: 2023-05-17 | Discharge: 2023-05-17 | Disposition: A | Discharge status: Home / Self Care | Payer: BC Managed Care – PPO | Source: Ambulatory Visit | Attending: Surgery | Admitting: Surgery

## 2023-05-17 ENCOUNTER — Other Ambulatory Visit (HOSPITAL_COMMUNITY)
Admission: RE | Admit: 2023-05-17 | Discharge: 2023-05-17 | Disposition: A | Discharge status: Home / Self Care | Payer: BC Managed Care – PPO | Source: Ambulatory Visit | Attending: Surgery | Admitting: Surgery

## 2023-05-17 DIAGNOSIS — E041 Nontoxic single thyroid nodule: Secondary | ICD-10-CM | POA: Insufficient documentation

## 2023-05-21 LAB — CYTOLOGY - NON PAP

## 2023-05-22 NOTE — Progress Notes (Signed)
Unfortunately the biopsy did not obtain enough tissue for evaluation.  It will need to be repeated by radiology with USN guidance.  Tresa Endo - please notify patient and schedule a repeat biopsy.  tmg  Darnell Level, MD [facility] practice Office: [phone]

## 2023-05-27 ENCOUNTER — Telehealth: Payer: Self-pay | Admitting: Surgery

## 2023-05-27 ENCOUNTER — Ambulatory Visit: Payer: Self-pay | Admitting: Surgery

## 2023-05-27 ENCOUNTER — Other Ambulatory Visit: Payer: BC Managed Care – PPO

## 2023-05-27 NOTE — H&P (View-Only) (Signed)
REFERRING PHYSICIAN: Jeani Hawking, MD  PROVIDER: Eyla Tallon Myra Rude, MD   Chief Complaint: New Consultation (Thyroid nodule)  History of Present Illness:  Patient is referred by her gynecologist, Dr. Marcelle Overlie, for surgical evaluation and recommendations regarding thyroid nodules. Patient was originally diagnosed with thyroid nodules in 2021 following her first pregnancy. She apparently had enlargement on the left side of the neck. She noted some discomfort and a globus sensation. She underwent imaging studies including a barium swallow study. She underwent ultrasound-guided fine-needle aspiration biopsy which returned with insufficient material for diagnosis, Bethesda category I. Patient has also been evaluated by ENT surgery including a CT scan of the neck performed February 2022. This demonstrated a nodule in the left thyroid lobe measuring 2 cm in size. There is also a 3.1 x 3.2 x 1.3 cm structure in the low anterior neck above the level of the left brachiocephalic vein possibly representing residual thymus. Patient apparently underwent a fine-needle aspiration biopsy at an outside facility and was told the cytopathology demonstrated up to a 90% risk of malignancy. Patient had a recent ultrasound performed in [place] in March 2024 which again demonstrates a 1.9 cm nodule in the left thyroid lobe which is solid and has peripheral calcifications and fine-needle aspiration biopsy was recommended.  Patient has had no prior head or neck surgery. She has never been on thyroid medication. Her most recent TSH level is normal at 1.70. There is no immediate family history of thyroid disease or thyroid cancer. Patient recently had successful delivery of her second child. Patient does have a brother-in-law who is an IT sales professional in the [employer] located at [place], [place].  Review of Systems: A complete review of systems was obtained from the patient. I have reviewed this information  and discussed as appropriate with the patient. See HPI as well for other ROS.  Review of Systems  Constitutional: Negative.  HENT: Negative.  Eyes: Negative.  Respiratory: Negative.  Cardiovascular: Negative.  Gastrointestinal:  Globus sensation  Genitourinary: Negative.  Musculoskeletal: Negative.  Skin: Negative.  Neurological: Negative.  Endo/Heme/Allergies: Negative.  Psychiatric/Behavioral: Negative.    Medical History: Past Medical History:  Diagnosis Date  Arthritis  Pott's disease   Patient Active Problem List  Diagnosis  Allergic reaction  Left thyroid nodule   Past Surgical History:  Procedure Laterality Date  TONSILLECTOMY  wisdom teeth/tibes in ears    Allergies  Allergen Reactions  Latex Anaphylaxis  Mold Cough  Other Cough  mildew   Current Outpatient Medications on File Prior to Visit  Medication Sig Dispense Refill  midodrine (PROAMATINE) 2.5 MG tablet Take 2.5 mg by mouth 3 (three) times daily  noreth-ethinyl estradiol/iron (ZENCHENT FE ORAL) Take 1 tablet by mouth once daily  sertraline HCl (ZOLOFT ORAL) Take 1 tablet by mouth once daily   No current facility-administered medications on file prior to visit.   History reviewed. No pertinent family history.   Social History   Tobacco Use  Smoking Status Never  Smokeless Tobacco Never    Social History   Socioeconomic History  Marital status: Married  Tobacco Use  Smoking status: Never  Smokeless tobacco: Never  Substance and Sexual Activity  Alcohol use: No  Drug use: No   Social Determinants of Health   Food Insecurity: No Food Insecurity (02/05/2023)  Received from [facility] [place]  Hunger Vital Sign  Worried About Running Out of Food in the Last Year: Never true  Ran Out of Food in  the Last Year: Never true  Transportation Needs: No Transportation Needs (02/05/2023)  Received from [facility] [place]  PRAPARE - Transportation  Lack of Transportation  (Medical): No  Lack of Transportation (Non-Medical): No   Objective:   Vitals:  BP: 102/70  Pulse: 69  Temp: 36.7 C (98 F)  SpO2: 99%  Weight: 82.7 kg (182 lb 6.4 oz)  Height: 172.7 cm (5\' 8" )  PainSc: 0-No pain  PainLoc: Neck   Body mass index is 27.73 kg/m.  Physical Exam   GENERAL APPEARANCE Comfortable, no acute issues Development: normal Gross deformities: none  SKIN Rash, lesions, ulcers: none Induration, erythema: none Nodules: none palpable  EYES Conjunctiva and lids: normal Pupils: equal and reactive  EARS, NOSE, MOUTH, THROAT External ears: no lesion or deformity External nose: no lesion or deformity Hearing: grossly normal  NECK Symmetric: yes Trachea: midline Thyroid: Right thyroid lobe is without palpable abnormality. In the mid left thyroid lobe is a relatively firm smooth mobile nontender nodule measuring about 2 cm in size. There is no associated lymphadenopathy. There is no mass palpable in the anterior central neck.  CHEST Respiratory effort: normal Retraction or accessory muscle use: no Breath sounds: normal bilaterally Rales, rhonchi, wheeze: none  CARDIOVASCULAR Auscultation: regular rhythm, normal rate Murmurs: none Pulses: radial pulse 2+ palpable Lower extremity edema: none  ABDOMEN Not assessed  GENITOURINARY/RECTAL Not assessed  MUSCULOSKELETAL Station and gait: normal Digits and nails: no clubbing or cyanosis Muscle strength: grossly normal all extremities Range of motion: grossly normal all extremities Deformity: none  LYMPHATIC Cervical: none palpable Supraclavicular: none palpable  PSYCHIATRIC Oriented to person, place, and time: yes Mood and affect: normal for situation Judgment and insight: appropriate for situation   Assessment and Plan:   Left thyroid nodule Anterior mediastinal mass  Patient is referred by her gynecologist for surgical evaluation and recommendations regarding a left thyroid  nodule.  Patient provided with a copy of "The Thyroid Book: Medical and Surgical Treatment of Thyroid Problems", published by Krames, 16 pages. Book reviewed and explained to patient during visit today.  Today we reviewed her clinical history. We reviewed previous imaging studies. We reviewed her laboratory studies. We reviewed a cytopathology report from 2021. After performing physical examination, I have recommended repeating the ultrasound-guided fine-needle aspiration biopsy of the left-sided thyroid nodule. This appears to be suspicious based on her imaging study from March 2024 performed in [place], [place]. She and I reviewed this report today.  We discussed possible outcomes from the biopsy. If the biopsy is benign, then I would recommend a follow-up ultrasound, TSH, and physical examination in 1 year. If the biopsy shows evidence of malignancy, then we discussed thyroid surgery, probably left thyroid lobectomy, with possible removal of the anterior mediastinal mass through the cervical incision at the same time. If the biopsy shows atypia, then we will submit the specimen for molecular genetic testing, AFIRMA, which will take approximately an additional 3 weeks to obtain results. We discussed the potential need for total thyroidectomy in the event that she required radioactive iodine ablation. The patient understands these potential outcomes from the biopsy and agrees to proceed.  We will arrange for ultrasound-guided fine-needle aspiration biopsy of the 1.9 cm left thyroid nodule in the near future at [facility]. I will contact the patient with the results and we will make plans for further management at that time.   Darnell Level, MD [facility] practice Office: [phone]

## 2023-05-27 NOTE — Telephone Encounter (Signed)
     Telephone call to patient to discuss additional biopsy versus surgery.  Patient wishes to proceed with surgery.  Will plan left thyroid lobectomy and possible resection of anterior mediastinal mass.  Discussed possible need for additional surgery pending pathology results.  Patient understands and wishes to proceed.  Will enter orders and send to schedulers to contact patient.  Darnell Level, MD [facility] practice Office: [phone]

## 2023-05-27 NOTE — H&P (Signed)
REFERRING PHYSICIAN: Jeani Hawking, MD  PROVIDER: Sherril Heyward Myra Rude, MD   Chief Complaint: New Consultation (Thyroid nodule)  History of Present Illness:  Patient is referred by her gynecologist, Dr. Marcelle Overlie, for surgical evaluation and recommendations regarding thyroid nodules. Patient was originally diagnosed with thyroid nodules in 2021 following her first pregnancy. She apparently had enlargement on the left side of the neck. She noted some discomfort and a globus sensation. She underwent imaging studies including a barium swallow study. She underwent ultrasound-guided fine-needle aspiration biopsy which returned with insufficient material for diagnosis, Bethesda category I. Patient has also been evaluated by ENT surgery including a CT scan of the neck performed February 2022. This demonstrated a nodule in the left thyroid lobe measuring 2 cm in size. There is also a 3.1 x 3.2 x 1.3 cm structure in the low anterior neck above the level of the left brachiocephalic vein possibly representing residual thymus. Patient apparently underwent a fine-needle aspiration biopsy at an outside facility and was told the cytopathology demonstrated up to a 90% risk of malignancy. Patient had a recent ultrasound performed in [place] in March 2024 which again demonstrates a 1.9 cm nodule in the left thyroid lobe which is solid and has peripheral calcifications and fine-needle aspiration biopsy was recommended.  Patient has had no prior head or neck surgery. She has never been on thyroid medication. Her most recent TSH level is normal at 1.70. There is no immediate family history of thyroid disease or thyroid cancer. Patient recently had successful delivery of her second child. Patient does have a brother-in-law who is an IT sales professional in the [employer] located at [place], [place].  Review of Systems: A complete review of systems was obtained from the patient. I have reviewed this information  and discussed as appropriate with the patient. See HPI as well for other ROS.  Review of Systems  Constitutional: Negative.  HENT: Negative.  Eyes: Negative.  Respiratory: Negative.  Cardiovascular: Negative.  Gastrointestinal:  Globus sensation  Genitourinary: Negative.  Musculoskeletal: Negative.  Skin: Negative.  Neurological: Negative.  Endo/Heme/Allergies: Negative.  Psychiatric/Behavioral: Negative.    Medical History: Past Medical History:  Diagnosis Date  Arthritis  Pott's disease   Patient Active Problem List  Diagnosis  Allergic reaction  Left thyroid nodule   Past Surgical History:  Procedure Laterality Date  TONSILLECTOMY  wisdom teeth/tibes in ears    Allergies  Allergen Reactions  Latex Anaphylaxis  Mold Cough  Other Cough  mildew   Current Outpatient Medications on File Prior to Visit  Medication Sig Dispense Refill  midodrine (PROAMATINE) 2.5 MG tablet Take 2.5 mg by mouth 3 (three) times daily  noreth-ethinyl estradiol/iron (ZENCHENT FE ORAL) Take 1 tablet by mouth once daily  sertraline HCl (ZOLOFT ORAL) Take 1 tablet by mouth once daily   No current facility-administered medications on file prior to visit.   History reviewed. No pertinent family history.   Social History   Tobacco Use  Smoking Status Never  Smokeless Tobacco Never    Social History   Socioeconomic History  Marital status: Married  Tobacco Use  Smoking status: Never  Smokeless tobacco: Never  Substance and Sexual Activity  Alcohol use: No  Drug use: No   Social Determinants of Health   Food Insecurity: No Food Insecurity (02/05/2023)  Received from [facility] [place]  Hunger Vital Sign  Worried About Running Out of Food in the Last Year: Never true  Ran Out of Food in  the Last Year: Never true  Transportation Needs: No Transportation Needs (02/05/2023)  Received from [facility] [place]  PRAPARE - Transportation  Lack of Transportation  (Medical): No  Lack of Transportation (Non-Medical): No   Objective:   Vitals:  BP: 102/70  Pulse: 69  Temp: 36.7 C (98 F)  SpO2: 99%  Weight: 82.7 kg (182 lb 6.4 oz)  Height: 172.7 cm (5\' 8" )  PainSc: 0-No pain  PainLoc: Neck   Body mass index is 27.73 kg/m.  Physical Exam   GENERAL APPEARANCE Comfortable, no acute issues Development: normal Gross deformities: none  SKIN Rash, lesions, ulcers: none Induration, erythema: none Nodules: none palpable  EYES Conjunctiva and lids: normal Pupils: equal and reactive  EARS, NOSE, MOUTH, THROAT External ears: no lesion or deformity External nose: no lesion or deformity Hearing: grossly normal  NECK Symmetric: yes Trachea: midline Thyroid: Right thyroid lobe is without palpable abnormality. In the mid left thyroid lobe is a relatively firm smooth mobile nontender nodule measuring about 2 cm in size. There is no associated lymphadenopathy. There is no mass palpable in the anterior central neck.  CHEST Respiratory effort: normal Retraction or accessory muscle use: no Breath sounds: normal bilaterally Rales, rhonchi, wheeze: none  CARDIOVASCULAR Auscultation: regular rhythm, normal rate Murmurs: none Pulses: radial pulse 2+ palpable Lower extremity edema: none  ABDOMEN Not assessed  GENITOURINARY/RECTAL Not assessed  MUSCULOSKELETAL Station and gait: normal Digits and nails: no clubbing or cyanosis Muscle strength: grossly normal all extremities Range of motion: grossly normal all extremities Deformity: none  LYMPHATIC Cervical: none palpable Supraclavicular: none palpable  PSYCHIATRIC Oriented to person, place, and time: yes Mood and affect: normal for situation Judgment and insight: appropriate for situation   Assessment and Plan:   Left thyroid nodule Anterior mediastinal mass  Patient is referred by her gynecologist for surgical evaluation and recommendations regarding a left thyroid  nodule.  Patient provided with a copy of "The Thyroid Book: Medical and Surgical Treatment of Thyroid Problems", published by Krames, 16 pages. Book reviewed and explained to patient during visit today.  Today we reviewed her clinical history. We reviewed previous imaging studies. We reviewed her laboratory studies. We reviewed a cytopathology report from 2021. After performing physical examination, I have recommended repeating the ultrasound-guided fine-needle aspiration biopsy of the left-sided thyroid nodule. This appears to be suspicious based on her imaging study from March 2024 performed in [place], [place]. She and I reviewed this report today.  We discussed possible outcomes from the biopsy. If the biopsy is benign, then I would recommend a follow-up ultrasound, TSH, and physical examination in 1 year. If the biopsy shows evidence of malignancy, then we discussed thyroid surgery, probably left thyroid lobectomy, with possible removal of the anterior mediastinal mass through the cervical incision at the same time. If the biopsy shows atypia, then we will submit the specimen for molecular genetic testing, AFIRMA, which will take approximately an additional 3 weeks to obtain results. We discussed the potential need for total thyroidectomy in the event that she required radioactive iodine ablation. The patient understands these potential outcomes from the biopsy and agrees to proceed.  We will arrange for ultrasound-guided fine-needle aspiration biopsy of the 1.9 cm left thyroid nodule in the near future at [facility]. I will contact the patient with the results and we will make plans for further management at that time.   Darnell Level, MD [facility] practice Office: [phone]

## 2023-05-29 NOTE — Progress Notes (Addendum)
Anesthesia Review:  PCP: Luiz Ochoa 05/23/23  Cardiologist : Graciela Husbands PT has POTS LOV 01/18/22  Chest x-ray : EKG : 06/03/23  Echo : Stress test: Cardiac Cath :  Activity level: can do a flight of stairs without difficutly  Sleep Study/ CPAP : none  Fasting Blood Sugar :      / Checks Blood Sugar -- times a day:   Blood Thinner/ Instructions /Last Dose: ASA / Instructions/ Last Dose :   PT recovering from " slight cold" per pt.  No current symptoms except slight nasal congestion.  PT aware that if symptoms do not improve to contact DR Gerkin.  PT denies any covid or flu.  PT reports 13 month old just started daycare and gve her a slight cold.    PT currently using breast pump.  PT made aware that she will have to discard breast milk for 24 hours after surgery.  PT voiced understanding.

## 2023-05-29 NOTE — Patient Instructions (Signed)
SURGICAL WAITING ROOM VISITATION  Patients having surgery or a procedure may have no more than 2 support people in the waiting area - these visitors may rotate.    Children under the age of 34 must have an adult with them who is not the patient.  Due to an increase in RSV and influenza rates and associated hospitalizations, children ages 29 and under may not visit patients in [facility] hospitals.  If the patient needs to stay at the hospital during part of their recovery, the visitor guidelines for inpatient rooms apply. Pre-op nurse will coordinate an appropriate time for 1 support person to accompany patient in pre-op.  This support person may not rotate.    Please refer to the [facility] website for the visitor guidelines for Inpatients (after your surgery is over and you are in a regular room).       Your procedure is scheduled on:   06/14/2023   Report to [facility] Main Entrance    Report to admitting at   [phone]   Call this number if you have problems the morning of surgery [phone]   Do not eat food :After Midnight.   After Midnight you may have the following liquids until __ 0430____ AM  DAY OF SURGERY  Water Non-Citrus Juices (without pulp, NO RED-Apple, White grape, White cranberry) Black Coffee (NO MILK/CREAM OR CREAMERS, sugar ok)  Clear Tea (NO MILK/CREAM OR CREAMERS, sugar ok) regular and decaf                             Plain Jell-O (NO RED)                                           Fruit ices (not with fruit pulp, NO RED)                                     Popsicles (NO RED)                                                               Sports drinks like Gatorade (NO RED)                             If you have questions, please contact your surgeon's office.       Oral Hygiene is also important to reduce your risk of infection.                                    Remember - BRUSH YOUR TEETH THE MORNING OF SURGERY WITH YOUR REGULAR  TOOTHPASTE  DENTURES WILL BE REMOVED PRIOR TO SURGERY PLEASE DO NOT APPLY "Poly grip" OR ADHESIVES!!!   Do NOT smoke after Midnight   Take these medicines the morning of surgery with A SIP OF WATER:  midodrine   DO NOT TAKE ANY ORAL DIABETIC MEDICATIONS DAY OF YOUR SURGERY  Bring CPAP mask and  tubing day of surgery.                              You may not have any metal on your body including hair pins, jewelry, and body piercing             Do not wear make-up, lotions, powders, perfumes/cologne, or deodorant  Do not wear nail polish including gel and S&S, artificial/acrylic nails, or any other type of covering on natural nails including finger and toenails. If you have artificial nails, gel coating, etc. that needs to be removed by a nail salon please have this removed prior to surgery or surgery may need to be canceled/ delayed if the surgeon/ anesthesia feels like they are unable to be safely monitored.   Do not shave  48 hours prior to surgery.               Men may shave face and neck.   Do not bring valuables to the hospital. [place] IS NOT             RESPONSIBLE   FOR VALUABLES.   Contacts, glasses, dentures or bridgework may not be worn into surgery.   Bring small overnight bag day of surgery.   DO NOT BRING YOUR HOME MEDICATIONS TO THE HOSPITAL. PHARMACY WILL DISPENSE MEDICATIONS LISTED ON YOUR MEDICATION LIST TO YOU DURING YOUR ADMISSION IN THE HOSPITAL!    Patients discharged on the day of surgery will not be allowed to drive home.  Someone NEEDS to stay with you for the first 24 hours after anesthesia.   Special Instructions: Bring a copy of your healthcare power of attorney and living will documents the day of surgery if you haven't scanned them before.              Please read over the following fact sheets you were given: IF YOU HAVE QUESTIONS ABOUT YOUR PRE-OP INSTRUCTIONS PLEASE CALL [phone]   If you received a COVID test during your pre-op visit   it is requested that you wear a mask when out in public, stay away from anyone that may not be feeling well and notify your surgeon if you develop symptoms. If you test positive for Covid or have been in contact with anyone that has tested positive in the last 10 days please notify you surgeon.    [place] - Preparing for Surgery Before surgery, you can play an important role.  Because skin is not sterile, your skin needs to be as free of germs as possible.  You can reduce the number of germs on your skin by washing with CHG (chlorahexidine gluconate) soap before surgery.  CHG is an antiseptic cleaner which kills germs and bonds with the skin to continue killing germs even after washing. Please DO NOT use if you have an allergy to CHG or antibacterial soaps.  If your skin becomes reddened/irritated stop using the CHG and inform your nurse when you arrive at Short Stay. Do not shave (including legs and underarms) for at least 48 hours prior to the first CHG shower.  You may shave your face/neck. Please follow these instructions carefully:  1.  Shower with CHG Soap the night before surgery and the  morning of Surgery.  2.  If you choose to wash your hair, wash your hair first as usual with your  normal  shampoo.  3.  After you shampoo, rinse your hair and  body thoroughly to remove the  shampoo.                           4.  Use CHG as you would any other liquid soap.  You can apply chg directly  to the skin and wash                       Gently with a scrungie or clean washcloth.  5.  Apply the CHG Soap to your body ONLY FROM THE NECK DOWN.   Do not use on face/ open                           Wound or open sores. Avoid contact with eyes, ears mouth and genitals (private parts).                       Wash face,  Genitals (private parts) with your normal soap.             6.  Wash thoroughly, paying special attention to the area where your surgery  will be performed.  7.  Thoroughly rinse your body  with warm water from the neck down.  8.  DO NOT shower/wash with your normal soap after using and rinsing off  the CHG Soap.                9.  Pat yourself dry with a clean towel.            10.  Wear clean pajamas.            11.  Place clean sheets on your bed the night of your first shower and do not  sleep with pets. Day of Surgery : Do not apply any lotions/deodorants the morning of surgery.  Please wear clean clothes to the hospital/surgery center.  FAILURE TO FOLLOW THESE INSTRUCTIONS MAY RESULT IN THE CANCELLATION OF YOUR SURGERY PATIENT SIGNATURE_________________________________  NURSE SIGNATURE__________________________________  ________________________________________________________________________

## 2023-06-03 ENCOUNTER — Other Ambulatory Visit: Payer: Self-pay

## 2023-06-03 ENCOUNTER — Encounter (HOSPITAL_COMMUNITY): Payer: Self-pay

## 2023-06-03 ENCOUNTER — Encounter (HOSPITAL_COMMUNITY)
Admission: RE | Admit: 2023-06-03 | Discharge: 2023-06-03 | Disposition: A | Discharge status: Home / Self Care | Payer: BC Managed Care – PPO | Source: Ambulatory Visit | Attending: Surgery | Admitting: Surgery

## 2023-06-03 VITALS — BP 123/72 | HR 76 | Temp 97.9°F | Resp 16 | Ht 68.0 in | Wt 182.0 lb

## 2023-06-03 DIAGNOSIS — Z01818 Encounter for other preprocedural examination: Secondary | ICD-10-CM | POA: Insufficient documentation

## 2023-06-03 HISTORY — DX: Essential (primary) hypertension: I10

## 2023-06-03 HISTORY — DX: Other specified postprocedural states: R11.2

## 2023-06-03 HISTORY — DX: Other specified postprocedural states: Z98.890

## 2023-06-03 HISTORY — DX: Pneumonia, unspecified organism: J18.9

## 2023-06-03 LAB — BASIC METABOLIC PANEL
Anion gap: 10 (ref 5–15)
BUN: 10 mg/dL (ref 6–20)
CO2: 26 mmol/L (ref 22–32)
Calcium: 9.5 mg/dL (ref 8.9–10.3)
Chloride: 107 mmol/L (ref 98–111)
Creatinine, Ser: 0.64 mg/dL (ref 0.44–1.00)
GFR, Estimated: >60 mL/min (ref >60)
Glucose, Bld: 86 mg/dL (ref 70–99)
Potassium: 4 mmol/L (ref 3.5–5.1)
Sodium: 143 mmol/L (ref 135–145)

## 2023-06-03 LAB — CBC
HCT: 38 % (ref 36.0–46.0)
Hemoglobin: 12 g/dL (ref 12.0–15.0)
MCH: 29.5 pg (ref 26.0–34.0)
MCHC: 31.6 g/dL (ref 30.0–36.0)
MCV: 93.4 fL (ref 80.0–100.0)
Platelets: 350 10*3/uL (ref 150–400)
RBC: 4.07 MIL/uL (ref 3.87–5.11)
RDW: 13.3 % (ref 11.5–15.5)
WBC: 10.1 10*3/uL (ref 4.0–10.5)
nRBC: 0 % (ref 0.0–0.2)

## 2023-06-09 ENCOUNTER — Encounter (HOSPITAL_COMMUNITY): Payer: Self-pay | Admitting: Surgery

## 2023-06-09 DIAGNOSIS — E041 Nontoxic single thyroid nodule: Secondary | ICD-10-CM | POA: Diagnosis present

## 2023-06-09 DIAGNOSIS — J9859 Other diseases of mediastinum, not elsewhere classified: Secondary | ICD-10-CM | POA: Diagnosis present

## 2023-06-13 NOTE — Anesthesia Preprocedure Evaluation (Addendum)
Anesthesia Evaluation  Patient identified by MRN, date of birth, ID band Patient awake    Reviewed: Allergy & Precautions, NPO status , Patient's Chart, lab work & pertinent test results  History of Anesthesia Complications (+) PONV and history of anesthetic complications  Airway Mallampati: II  TM Distance: >3 FB Neck ROM: Full    Dental  (+) Dental Advisory Given,    Pulmonary neg shortness of breath, neg sleep apnea, neg COPD, Recent URI , Resolved   Pulmonary exam normal breath sounds clear to auscultation       Cardiovascular hypertension, (-) angina (-) Past MI, (-) Cardiac Stents and (-) CABG (-) dysrhythmias  Rhythm:Regular Rate:Normal  POTS   Neuro/Psych  Headaches, neg Seizures PSYCHIATRIC DISORDERS Anxiety        GI/Hepatic negative GI ROS, Neg liver ROS,,,  Endo/Other  neg diabetes Hyperthyroidism Left thyroid nodule with mediastinal mass  Renal/GU negative Renal ROS     Musculoskeletal scoliosis   Abdominal   Peds  Hematology  (+) Blood dyscrasia, anemia Lab Results      Component                Value               Date                      WBC                      10.1                06/03/2023                HGB                      12.0                06/03/2023                HCT                      38.0                06/03/2023                MCV                      93.4                06/03/2023                PLT                      350                 06/03/2023              Anesthesia Other Findings Has difficulty swallowing solid foods. No SOB with lying flat. Trachea midline.  Reproductive/Obstetrics                              Anesthesia Physical Anesthesia Plan  ASA: 2  Anesthesia Plan: General   Post-op Pain Management: Tylenol PO (pre-op)*   Induction: Intravenous  PONV Risk Score and Plan: 4 or greater and Ondansetron, Dexamethasone, Midazolam,  Treatment may vary due to age or medical condition and  Propofol infusion  Airway Management Planned: Oral ETT  Additional Equipment:   Intra-op Plan:   Post-operative Plan: Extubation in OR  Informed Consent: I have reviewed the patients History and Physical, chart, labs and discussed the procedure including the risks, benefits and alternatives for the proposed anesthesia with the patient or authorized representative who has indicated his/her understanding and acceptance.     Dental advisory given  Plan Discussed with: CRNA and Anesthesiologist  Anesthesia Plan Comments: (Risks of general anesthesia discussed including, but not limited to, sore throat, hoarse voice, chipped/damaged teeth, injury to vocal cords, nausea and vomiting, allergic reactions, lung infection, heart attack, stroke, and death. All questions answered. )         Anesthesia Quick Evaluation

## 2023-06-14 ENCOUNTER — Other Ambulatory Visit: Payer: Self-pay

## 2023-06-14 ENCOUNTER — Ambulatory Visit (HOSPITAL_COMMUNITY): Payer: BC Managed Care – PPO | Admitting: Physician Assistant

## 2023-06-14 ENCOUNTER — Encounter (HOSPITAL_COMMUNITY)
Admission: RE | Disposition: A | Discharge status: Home / Self Care | Payer: Self-pay | Source: Ambulatory Visit | Attending: Surgery

## 2023-06-14 ENCOUNTER — Encounter (HOSPITAL_COMMUNITY): Payer: Self-pay | Admitting: Surgery

## 2023-06-14 ENCOUNTER — Ambulatory Visit (HOSPITAL_COMMUNITY)
Admission: RE | Admit: 2023-06-14 | Discharge: 2023-06-15 | Disposition: A | Discharge status: Home / Self Care | Payer: BC Managed Care – PPO | Source: Ambulatory Visit | Attending: Surgery | Admitting: Surgery

## 2023-06-14 ENCOUNTER — Ambulatory Visit (HOSPITAL_COMMUNITY): Payer: BC Managed Care – PPO | Admitting: Anesthesiology

## 2023-06-14 DIAGNOSIS — E041 Nontoxic single thyroid nodule: Secondary | ICD-10-CM | POA: Diagnosis present

## 2023-06-14 DIAGNOSIS — G90A Postural orthostatic tachycardia syndrome (POTS): Secondary | ICD-10-CM | POA: Diagnosis not present

## 2023-06-14 DIAGNOSIS — J9859 Other diseases of mediastinum, not elsewhere classified: Secondary | ICD-10-CM | POA: Diagnosis present

## 2023-06-14 DIAGNOSIS — Z01818 Encounter for other preprocedural examination: Secondary | ICD-10-CM

## 2023-06-14 DIAGNOSIS — R222 Localized swelling, mass and lump, trunk: Secondary | ICD-10-CM | POA: Insufficient documentation

## 2023-06-14 DIAGNOSIS — E042 Nontoxic multinodular goiter: Secondary | ICD-10-CM | POA: Insufficient documentation

## 2023-06-14 HISTORY — PX: THYROID LOBECTOMY: SHX420

## 2023-06-14 LAB — POCT PREGNANCY, URINE: Preg Test, Ur: NEGATIVE

## 2023-06-14 SURGERY — LOBECTOMY, THYROID
Anesthesia: General | Site: Neck | Laterality: Left

## 2023-06-14 MED ORDER — CHLORHEXIDINE GLUCONATE 0.12 % MT SOLN
15.0000 mL | Freq: Once | OROMUCOSAL | Status: AC
  Administered 2023-06-14: 15 mL via OROMUCOSAL

## 2023-06-14 MED ORDER — CEFAZOLIN SODIUM-DEXTROSE 2-4 GM/100ML-% IV SOLN
2.0000 g | INTRAVENOUS | Status: AC
  Administered 2023-06-14: 2 g via INTRAVENOUS
  Filled 2023-06-14: qty 100

## 2023-06-14 MED ORDER — LIDOCAINE HCL (PF) 2 % IJ SOLN
INTRAMUSCULAR | Status: AC
  Filled 2023-06-14: qty 5

## 2023-06-14 MED ORDER — MIDODRINE HCL 5 MG PO TABS
2.5000 mg | ORAL_TABLET | Freq: Three times a day (TID) | ORAL | Status: DC
  Administered 2023-06-14 – 2023-06-15 (×2): 2.5 mg via ORAL
  Filled 2023-06-14 (×2): qty 1

## 2023-06-14 MED ORDER — OXYCODONE HCL 5 MG PO TABS
5.0000 mg | ORAL_TABLET | ORAL | Status: DC | PRN
  Administered 2023-06-14: 5 mg via ORAL
  Filled 2023-06-14: qty 1

## 2023-06-14 MED ORDER — ACETAMINOPHEN 500 MG PO TABS
1000.0000 mg | ORAL_TABLET | Freq: Once | ORAL | Status: AC
  Administered 2023-06-14: 1000 mg via ORAL
  Filled 2023-06-14: qty 2

## 2023-06-14 MED ORDER — ROCURONIUM BROMIDE 10 MG/ML (PF) SYRINGE
PREFILLED_SYRINGE | INTRAVENOUS | Status: AC
  Filled 2023-06-14: qty 10

## 2023-06-14 MED ORDER — FENTANYL CITRATE PF 50 MCG/ML IJ SOSY
PREFILLED_SYRINGE | INTRAMUSCULAR | Status: AC
  Filled 2023-06-14: qty 2

## 2023-06-14 MED ORDER — FENTANYL CITRATE (PF) 250 MCG/5ML IJ SOLN
INTRAMUSCULAR | Status: DC | PRN
  Administered 2023-06-14: 100 ug via INTRAVENOUS
  Administered 2023-06-14 (×2): 50 ug via INTRAVENOUS

## 2023-06-14 MED ORDER — HYDROMORPHONE HCL 1 MG/ML IJ SOLN: 1.0000 mg | INTRAMUSCULAR | Status: DC | PRN

## 2023-06-14 MED ORDER — DEXAMETHASONE SODIUM PHOSPHATE 10 MG/ML IJ SOLN
INTRAMUSCULAR | Status: DC | PRN
  Administered 2023-06-14: 10 mg via INTRAVENOUS

## 2023-06-14 MED ORDER — SERTRALINE HCL 100 MG PO TABS
100.0000 mg | ORAL_TABLET | Freq: Every day | ORAL | Status: DC
  Administered 2023-06-14 – 2023-06-15 (×2): 100 mg via ORAL
  Filled 2023-06-14 (×2): qty 1

## 2023-06-14 MED ORDER — PROMETHAZINE HCL 25 MG/ML IJ SOLN: 6.2500 mg | INTRAMUSCULAR | Status: DC | PRN

## 2023-06-14 MED ORDER — CHLORHEXIDINE GLUCONATE CLOTH 2 % EX PADS: 6.0000 | MEDICATED_PAD | Freq: Once | CUTANEOUS | Status: DC

## 2023-06-14 MED ORDER — LIDOCAINE 2% (20 MG/ML) 5 ML SYRINGE
INTRAMUSCULAR | Status: DC | PRN
  Administered 2023-06-14: 100 mg via INTRAVENOUS

## 2023-06-14 MED ORDER — SODIUM CHLORIDE 0.45 % IV SOLN: INTRAVENOUS | Status: DC

## 2023-06-14 MED ORDER — ONDANSETRON 4 MG PO TBDP: 4.0000 mg | ORAL_TABLET | Freq: Four times a day (QID) | ORAL | Status: DC | PRN

## 2023-06-14 MED ORDER — OXYCODONE HCL 5 MG/5ML PO SOLN: 5.0000 mg | Freq: Once | ORAL | Status: DC | PRN

## 2023-06-14 MED ORDER — TRAMADOL HCL 50 MG PO TABS
50.0000 mg | ORAL_TABLET | Freq: Four times a day (QID) | ORAL | Status: DC | PRN
  Administered 2023-06-14 – 2023-06-15 (×3): 50 mg via ORAL
  Filled 2023-06-14 (×4): qty 1

## 2023-06-14 MED ORDER — HEMOSTATIC AGENTS (NO CHARGE) OPTIME
TOPICAL | Status: DC | PRN
  Administered 2023-06-14: 1 via TOPICAL

## 2023-06-14 MED ORDER — DEXAMETHASONE SODIUM PHOSPHATE 10 MG/ML IJ SOLN
INTRAMUSCULAR | Status: AC
  Filled 2023-06-14: qty 1

## 2023-06-14 MED ORDER — FENTANYL CITRATE (PF) 250 MCG/5ML IJ SOLN
INTRAMUSCULAR | Status: AC
  Filled 2023-06-14: qty 5

## 2023-06-14 MED ORDER — 0.9 % SODIUM CHLORIDE (POUR BTL) OPTIME
TOPICAL | Status: DC | PRN
  Administered 2023-06-14: 1000 mL

## 2023-06-14 MED ORDER — ONDANSETRON HCL 4 MG/2ML IJ SOLN
INTRAMUSCULAR | Status: DC | PRN
  Administered 2023-06-14: 4 mg via INTRAVENOUS

## 2023-06-14 MED ORDER — MIDAZOLAM HCL 2 MG/2ML IJ SOLN
INTRAMUSCULAR | Status: AC
  Filled 2023-06-14: qty 2

## 2023-06-14 MED ORDER — LACTATED RINGERS IV SOLN: INTRAVENOUS | Status: DC

## 2023-06-14 MED ORDER — PROPOFOL 10 MG/ML IV BOLUS
INTRAVENOUS | Status: AC
  Filled 2023-06-14: qty 20

## 2023-06-14 MED ORDER — ONDANSETRON HCL 4 MG/2ML IJ SOLN
4.0000 mg | Freq: Four times a day (QID) | INTRAMUSCULAR | Status: DC | PRN
  Administered 2023-06-14: 4 mg via INTRAVENOUS
  Filled 2023-06-14: qty 2

## 2023-06-14 MED ORDER — OXYCODONE HCL 5 MG PO TABS: 5.0000 mg | ORAL_TABLET | Freq: Once | ORAL | Status: DC | PRN

## 2023-06-14 MED ORDER — ONDANSETRON HCL 4 MG/2ML IJ SOLN
INTRAMUSCULAR | Status: AC
  Filled 2023-06-14: qty 2

## 2023-06-14 MED ORDER — ACETAMINOPHEN 325 MG PO TABS
650.0000 mg | ORAL_TABLET | Freq: Four times a day (QID) | ORAL | Status: DC | PRN
  Administered 2023-06-15 (×2): 650 mg via ORAL
  Filled 2023-06-14 (×2): qty 2

## 2023-06-14 MED ORDER — MIDAZOLAM HCL 2 MG/2ML IJ SOLN
INTRAMUSCULAR | Status: DC | PRN
  Administered 2023-06-14: 2 mg via INTRAVENOUS

## 2023-06-14 MED ORDER — TRAMADOL HCL 50 MG PO TABS: 50.0000 mg | ORAL_TABLET | Freq: Four times a day (QID) | ORAL | 0 refills | Status: AC | PRN

## 2023-06-14 MED ORDER — ROCURONIUM BROMIDE 10 MG/ML (PF) SYRINGE
PREFILLED_SYRINGE | INTRAVENOUS | Status: DC | PRN
  Administered 2023-06-14: 10 mg via INTRAVENOUS
  Administered 2023-06-14: 50 mg via INTRAVENOUS

## 2023-06-14 MED ORDER — ACETAMINOPHEN 650 MG RE SUPP: 650.0000 mg | Freq: Four times a day (QID) | RECTAL | Status: DC | PRN

## 2023-06-14 MED ORDER — PROPOFOL 10 MG/ML IV BOLUS
INTRAVENOUS | Status: DC | PRN
Start: 2023-06-14 — End: 2023-06-14
  Administered 2023-06-14: 50 mg via INTRAVENOUS
  Administered 2023-06-14: 150 mg via INTRAVENOUS

## 2023-06-14 MED ORDER — SUGAMMADEX SODIUM 200 MG/2ML IV SOLN
INTRAVENOUS | Status: DC | PRN
  Administered 2023-06-14: 200 mg via INTRAVENOUS

## 2023-06-14 MED ORDER — ORAL CARE MOUTH RINSE: 15.0000 mL | Freq: Once | OROMUCOSAL | Status: AC

## 2023-06-14 MED ORDER — FENTANYL CITRATE PF 50 MCG/ML IJ SOSY
25.0000 ug | PREFILLED_SYRINGE | INTRAMUSCULAR | Status: DC | PRN
  Administered 2023-06-14 (×2): 50 ug via INTRAVENOUS

## 2023-06-14 SURGICAL SUPPLY — 33 items
ADH SKN CLS APL DERMABOND .7 (GAUZE/BANDAGES/DRESSINGS) ×1
APL PRP STRL LF DISP 70% ISPRP (MISCELLANEOUS) ×1
ATTRACTOMAT 16X20 MAGNETIC DRP (DRAPES) ×1 IMPLANT
BAG COUNTER SPONGE SURGICOUNT (BAG) ×1 IMPLANT
BAG SPNG CNTER NS LX DISP (BAG) ×1
BLADE SURG 15 STRL LF DISP TIS (BLADE) ×1 IMPLANT
BLADE SURG 15 STRL SS (BLADE) ×1
CHLORAPREP W/TINT 26 (MISCELLANEOUS) ×1 IMPLANT
CLIP TI MEDIUM 6 (CLIP) ×2 IMPLANT
CLIP TI WIDE RED SMALL 6 (CLIP) ×2 IMPLANT
COVER SURGICAL LIGHT HANDLE (MISCELLANEOUS) ×1 IMPLANT
DERMABOND ADVANCED .7 DNX12 (GAUZE/BANDAGES/DRESSINGS) ×1 IMPLANT
DRAPE LAPAROTOMY T 98X78 PEDS (DRAPES) ×1 IMPLANT
DRAPE UTILITY XL STRL (DRAPES) ×1 IMPLANT
ELECT PENCIL ROCKER SW 15FT (MISCELLANEOUS) ×1 IMPLANT
ELECT REM PT RETURN 15FT ADLT (MISCELLANEOUS) ×1 IMPLANT
GAUZE 4X4 16PLY ~~LOC~~+RFID DBL (SPONGE) ×1 IMPLANT
GLOVE SURG ORTHO 8.0 STRL STRW (GLOVE) ×1 IMPLANT
GOWN STRL REUS W/ TWL XL LVL3 (GOWN DISPOSABLE) ×2 IMPLANT
GOWN STRL REUS W/TWL XL LVL3 (GOWN DISPOSABLE) ×2
HEMOSTAT SURGICEL 2X4 FIBR (HEMOSTASIS) ×1 IMPLANT
ILLUMINATOR WAVEGUIDE N/F (MISCELLANEOUS) ×1 IMPLANT
KIT BASIN OR (CUSTOM PROCEDURE TRAY) ×1 IMPLANT
KIT TURNOVER KIT A (KITS) IMPLANT
PACK BASIC VI WITH GOWN DISP (CUSTOM PROCEDURE TRAY) ×1 IMPLANT
SHEARS HARMONIC 9CM CVD (BLADE) ×1 IMPLANT
SUT MNCRL AB 4-0 PS2 18 (SUTURE) ×1 IMPLANT
SUT SILK 3 0 SH 30 (SUTURE) ×1 IMPLANT
SUT VIC AB 3-0 SH 18 (SUTURE) ×2 IMPLANT
SYR BULB IRRIG 60ML STRL (SYRINGE) ×1 IMPLANT
TOWEL OR 17X26 10 PK STRL BLUE (TOWEL DISPOSABLE) ×1 IMPLANT
TOWEL OR NON WOVEN STRL DISP B (DISPOSABLE) ×1 IMPLANT
TUBING CONNECTING 10 (TUBING) ×1 IMPLANT

## 2023-06-14 NOTE — Interval H&P Note (Signed)
History and Physical Interval Note:  06/14/2023 7:07 AM  Stephanie Hudson  has presented today for surgery, with the diagnosis of LEFT THYROID NODULE, ANTERIOR MEDIASTINAL MASS.  The various methods of treatment have been discussed with the patient and family. After consideration of risks, benefits and other options for treatment, the patient has consented to    Procedure(s) with comments: LEFT THYROID LOBECTOMY, POSSIBLE RESECTION ANTERIOR MEDISTINAL MASS (Left) - 2ND SCRUB ALLERGY TO LATEX ANAPHALAXIS   as a surgical intervention.  The patient's history has been reviewed, patient examined, no change in status, stable for surgery.  I have reviewed the patient's chart and labs.  Questions were answered to the patient's satisfaction.    Darnell Level, MD [facility] Surgery A [facility] practice Office: [phone]   Darnell Level

## 2023-06-14 NOTE — Discharge Instructions (Signed)
[facility] - Dr. Rainier Feuerborn  THYROID & PARATHYROID SURGERY:  POST-OP INSTRUCTIONS  Always review the instruction sheet provided by the hospital nurse at discharge.  A prescription for pain medication may be sent to your pharmacy at the time of discharge.  Take your pain medication as prescribed.  If narcotic pain medicine is not needed, then you may take acetaminophen (Tylenol) or ibuprofen (Advil) as needed for pain or soreness.  Take your normal home medications as prescribed unless otherwise directed.  If you need a refill on your pain medication, please contact the office during regular business hours.  Prescriptions will not be processed by the office after 5:00PM or on weekends.  Start with a light diet upon arrival home, such as soup and crackers or toast.  Be sure to drink plenty of fluids.  Resume your normal diet the day after surgery.  Most patients will experience some swelling and bruising on the chest and neck area.  Ice packs will help for the first 48 hours after arriving home.  Swelling and bruising will take several days to resolve.   It is common to experience some constipation after surgery.  Increasing fluid intake and taking a stool softener (Colace) will usually help to prevent this problem.  A mild laxative (Milk of Magnesia or Miralax) should be taken according to package directions if there has been no bowel movement after 48 hours.  Dermabond glue covers your incision. This seals the wound and you may shower at any time. The Dermabond will remain in place for about a week.  You may gradually remove the glue when it loosens around the edges.  If you need to loosen the Dermabond for removal, apply a layer of Vaseline to the wound for 15 minutes and then remove with a Kleenex. Your sutures are under the skin and will not show - they will dissolve on their own.  You may resume light daily activities beginning the day after discharge (such as self-care,  walking, climbing stairs), gradually increasing activities as tolerated. You may have sexual intercourse when it is comfortable. Refrain from any heavy lifting or straining until approved by your doctor. You may drive when you no longer are taking prescription pain medication, you can comfortably wear a seatbelt, and you can safely maneuver your car and apply the brakes.  You will see your doctor in the office for a follow-up appointment approximately three weeks after your surgery.  Make sure that you call for this appointment within a day or two after you arrive home to insure a convenient appointment time. Please have any requested laboratory tests performed a few days prior to your office visit so that the results will be available at your follow up appointment.  WHEN TO CALL THE [facility] OFFICE: -- Fever greater than 101.5 -- Inability to urinate -- Nausea and/or vomiting - persistent -- Extreme swelling or bruising -- Continued bleeding from incision -- Increased pain, redness, or drainage from the incision -- Difficulty swallowing or breathing -- Muscle cramping or spasms -- Numbness or tingling in hands or around lips  The clinic staff is available to answer your questions during regular business hours.  Please don't hesitate to call and ask to speak to one of the nurses if you have concerns.  [facility] OFFICE: [phone] (24 hours)  Please sign up for MyChart accounts. This will allow you to communicate directly with my nurse or myself without having to call the office. It will also allow you   to view your test results. You will need to enroll in MyChart for my office ([facility]) and for the hospital ([facility]).  Taliesin Hartlage, MD [facility] practice 

## 2023-06-14 NOTE — Plan of Care (Signed)

## 2023-06-14 NOTE — Transfer of Care (Signed)
Immediate Anesthesia Transfer of Care Note  Patient: Stephanie Hudson  Procedure(s) Performed: LEFT THYROID LOBECTOMY, RESECTION ANTERIOR MEDISTINAL MASS (Left: Neck)  Patient Location: PACU  Anesthesia Type:General  Level of Consciousness: drowsy  Airway & Oxygen Therapy: Patient Spontanous Breathing and Patient connected to face mask oxygen  Post-op Assessment: Report given to RN and Post -op Vital signs reviewed and stable  Post vital signs: Reviewed and stable  Last Vitals:  Vitals Value Taken Time  BP 127/85 06/14/23 0916  Temp    Pulse 75 06/14/23 0917  Resp 16 06/14/23 0917  SpO2 100 % 06/14/23 0917  Vitals shown include unfiled device data.  Last Pain:  Vitals:   06/14/23 0623  TempSrc:   PainSc: 0-No pain         Complications: No notable events documented.

## 2023-06-14 NOTE — Anesthesia Procedure Notes (Signed)
Procedure Name: Intubation Date/Time: 06/14/2023 7:27 AM  Performed by: Florene Route, CRNAPre-anesthesia Checklist: Patient identified, Emergency Drugs available, Suction available and Patient being monitored Patient Re-evaluated:Patient Re-evaluated prior to induction Oxygen Delivery Method: Circle system utilized Preoxygenation: Pre-oxygenation with 100% oxygen Induction Type: IV induction Ventilation: Mask ventilation without difficulty Laryngoscope Size: Miller and 2 Grade View: Grade I Tube type: Reinforced Number of attempts: 1 Airway Equipment and Method: Stylet and Oral airway Placement Confirmation: ETT inserted through vocal cords under direct vision, positive ETCO2 and breath sounds checked- equal and bilateral Secured at: 21 cm Tube secured with: Tape Dental Injury: Teeth and Oropharynx as per pre-operative assessment

## 2023-06-14 NOTE — Op Note (Signed)
Procedure Note  Pre-operative Diagnosis:  left thyroid nodule, anterior mediastinal mass  Post-operative Diagnosis:  same  Surgeon:  Darnell Level, MD  Assistant:  none   Procedure:  Left thyroid lobectomy and isthmusectomy, resection of anterior mediastinal mass  Anesthesia:  General  Estimated Blood Loss:  20 cc  Drains: none         Specimen: thyroid lobe to pathology; anterior mediastinal mass to pathology  Indications:  Patient is referred by her gynecologist, Dr. Marcelle Overlie, for surgical evaluation and recommendations regarding thyroid nodules. Patient was originally diagnosed with thyroid nodules in 2021 following her first pregnancy. She apparently had enlargement on the left side of the neck. She noted some discomfort and a globus sensation. She underwent imaging studies including a barium swallow study. She underwent ultrasound-guided fine-needle aspiration biopsy which returned with insufficient material for diagnosis, [place] category I. Patient has also been evaluated by ENT surgery including a CT scan of the neck performed February 2022. This demonstrated a nodule in the left thyroid lobe measuring 2 cm in size. There is also a 3.1 x 3.2 x 1.3 cm structure in the low anterior neck above the level of the left brachiocephalic vein possibly representing residual thymus. Patient apparently underwent a fine-needle aspiration biopsy at an outside facility and was told the cytopathology demonstrated up to a 90% risk of malignancy. Patient had a recent ultrasound performed in [place] in March 2024 which again demonstrates a 1.9 cm nodule in the left thyroid lobe which is solid and has peripheral calcifications and fine-needle aspiration biopsy was recommended.   Procedure Details: Procedure was done in OR #4 at the [facility]. The patient was brought to the operating room and placed in a supine position on the operating room table. Following administration of general  anesthesia, the patient was positioned and then prepped and draped in the usual aseptic fashion. After ascertaining that an adequate level of anesthesia had been achieved, a small Kocher incision was made with #15 blade. Dissection was carried through subcutaneous tissues and platysma. Hemostasis was achieved with the electrocautery. Skin flaps were elevated cephalad and caudad from the thyroid notch to the sternal notch. A self-retaining retractor was placed for exposure. Strap muscles were incised in the midline and dissection was begun on the left side. Strap muscles were reflected laterally. The left thyroid lobe was mildy enlarged with a central nodule which appeared calcified. The lobe was gently mobilized with blunt dissection. Superior pole vessels were dissected out and divided individually between small and medium ligaclips with the harmonic scalpel. The thyroid lobe was rolled anteriorly. Branches of the inferior thyroid artery were divided between small ligaclips with the harmonic scalpel. Inferior venous tributaries were divided between ligaclips. Both the superior and inferior parathyroid glands were identified and preserved on their vascular pedicles. The recurrent laryngeal nerve was identified and preserved along its course. The ligament of Allyson Sabal was released with the electrocautery and the gland was mobilized onto the anterior trachea. Isthmus was mobilized across the midline. There was a small pyramidal lobe present which was resected with the isthmus. The thyroid parenchyma was transected at the junction of the isthmus and contralateral thyroid lobe with the harmonic scalpel. A suture was used to mark the isthmus margin. The thyroid lobe and isthmus were submitted to pathology for review.  The entire field was palpated for evidence of lymphadenopathy or extra-thyroidal disease.  No worrisome findings were noted.  No enlarged lymph nodes were identified.  Beginning anterior to  the trachea  below the level of the thyroid isthmus dissection was carried into the anterior mediastinum.  Using the CT scan for guidance, a soft tissue mass anterior to the trachea extending down to the innominate vein was dissected out.  Vascular tributaries were divided between small and medium ligaclips.  Harmonic scalpel was used for dissection.  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  It is excised in its entirety measuring approximately 3 x 5 x 2 cm in size.  It is submitted separately to pathology for review.  The neck was irrigated with warm saline. Fibrillar was placed throughout the operative field. Strap muscles were approximated in the midline with interrupted 3-0 Vicryl sutures. Platysma was closed with interrupted 3-0 Vicryl sutures. Skin was closed with a running 4-0 Monocryl subcuticular suture.  Wound was washed and dried and Dermabond was applied. The patient was awakened from anesthesia and brought to the recovery room. The patient tolerated the procedure well.   Darnell Level, MD [facility] Surgery Office: [phone]

## 2023-06-14 NOTE — Anesthesia Postprocedure Evaluation (Signed)
Anesthesia Post Note  Patient: Stephanie Hudson  Procedure(s) Performed: LEFT THYROID LOBECTOMY, RESECTION ANTERIOR MEDISTINAL MASS (Left: Neck)     Patient location during evaluation: PACU Anesthesia Type: General Level of consciousness: awake Pain management: pain level controlled Vital Signs Assessment: post-procedure vital signs reviewed and stable Respiratory status: spontaneous breathing, nonlabored ventilation and respiratory function stable Cardiovascular status: blood pressure returned to baseline and stable Postop Assessment: no apparent nausea or vomiting Anesthetic complications: no   No notable events documented.  Last Vitals:  Vitals:   06/14/23 1030 06/14/23 1045  BP: 118/78 113/78  Pulse: (!) 58 80  Resp: 14 14  Temp:  36.5 C  SpO2: 98% 99%    Last Pain:  Vitals:   06/14/23 1045  TempSrc:   PainSc: Asleep                 Linton Rump

## 2023-06-15 ENCOUNTER — Encounter (HOSPITAL_COMMUNITY): Payer: Self-pay | Admitting: Surgery

## 2023-06-15 DIAGNOSIS — E042 Nontoxic multinodular goiter: Secondary | ICD-10-CM | POA: Diagnosis not present

## 2023-06-15 NOTE — Progress Notes (Signed)
Transition of Care [facility]) - Inpatient Brief Assessment   Patient Details  Name: Stephanie Hudson MRN: [id number] Date of Birth: [birth year]  Transition of Care [facility] CM/SW Contact:    Adrian Prows, RN Phone Number: 06/15/2023, 9:35 AM   Clinical Narrative: Brief assessment completed.   Transition of Care Asessment: Insurance and Status: Insurance coverage has been reviewed Patient has primary care physician: Yes Home environment has been reviewed: yes Prior level of function:: independent Prior/Current Home Services: No current home services Social Determinants of Health Reivew: SDOH reviewed no interventions necessary Readmission risk has been reviewed: Yes Transition of care needs: no transition of care needs at this time

## 2023-06-15 NOTE — Discharge Summary (Signed)
Physician Discharge Summary  Patient ID: Stephanie Hudson MRN: [id number] DOB/AGE: 11-Jun-1991 32 y.o.  Admit date: 06/14/2023 Discharge date: 06/15/2023  Admission Diagnoses:  Discharge Diagnoses:  Principal Problem:   Left thyroid nodule Active Problems:   Mediastinal mass   Discharged Condition: good  Hospital Course: did well overnight after thyroid surgery  Consults: None  Significant Diagnostic Studies: none  Treatments: surgery: L thyroid lobectomy  Discharge Exam: Blood pressure 108/64, pulse 68, temperature 98.2 F (36.8 C), temperature source Oral, resp. rate 16, height 5\' 8"  (1.727 m), weight 84.4 kg, SpO2 98%, currently breastfeeding. General appearance: alert and cooperative Neck: no swelling Incision/Wound: Clean, dry, intact Disposition: Discharge disposition: 01-Home or Self Care        Allergies as of 06/15/2023       Reactions   Latex Anaphylaxis        Medication List     STOP taking these medications    oxyCODONE 5 MG immediate release tablet Commonly known as: Oxy IR/ROXICODONE       TAKE these medications    acetaminophen 325 MG tablet Commonly known as: TYLENOL Take 2 tablets (650 mg total) by mouth every 4 (four) hours as needed for mild pain (temperature > 101.5.).   EPINEPHrine 0.3 mg/0.3 mL Soaj injection Commonly known as: EPI-PEN Inject 0.3 mLs (0.3 mg total) into the muscle once. For severe allergic reaction   ibuprofen 600 MG tablet Commonly known as: ADVIL Take 1 tablet (600 mg total) by mouth every 6 (six) hours.   midodrine 2.5 MG tablet Commonly known as: PROAMATINE Take 2.5 mg by mouth in the morning, at noon, and at bedtime.   Prenatal Gummies 0.18-25 MG Chew Chew 2 each by mouth daily.   sertraline 100 MG tablet Commonly known as: ZOLOFT Take 100 mg by mouth daily.   traMADol 50 MG tablet Commonly known as: ULTRAM Take 1 tablet (50 mg total) by mouth every 6 (six) hours as needed for moderate pain.         Follow-up Information     Darnell Level, MD. Schedule an appointment as soon as possible for a visit in 3 week(s).   Specialty: General Surgery Why: For wound re-check Contact information: [address] [place] [address] [phone]                 Signed: Vanita Panda 06/15/2023, 8:55 AM

## 2023-06-15 NOTE — Plan of Care (Signed)

## 2023-06-17 NOTE — Progress Notes (Signed)
Good news!  All pathology results are benign.  Will have written report for you when you come to the office.  tmg  Darnell Level, MD [facility] A [facility] practice Office: [phone]
